# Patient Record
Sex: Female | Born: 1955 | Race: White | Hispanic: No | Marital: Married | Smoking: Never smoker
Health system: Southern US, Community
[De-identification: ages and names within clinical notes are randomized; demographics above are authoritative.]

## PROBLEM LIST (undated history)

## (undated) DIAGNOSIS — K219 Gastro-esophageal reflux disease without esophagitis: Secondary | ICD-10-CM

## (undated) DIAGNOSIS — E079 Disorder of thyroid, unspecified: Secondary | ICD-10-CM

## (undated) DIAGNOSIS — E039 Hypothyroidism, unspecified: Secondary | ICD-10-CM

## (undated) DIAGNOSIS — F419 Anxiety disorder, unspecified: Secondary | ICD-10-CM

## (undated) DIAGNOSIS — R0600 Dyspnea, unspecified: Secondary | ICD-10-CM

## (undated) DIAGNOSIS — R32 Unspecified urinary incontinence: Secondary | ICD-10-CM

## (undated) DIAGNOSIS — R51 Headache: Secondary | ICD-10-CM

## (undated) DIAGNOSIS — D649 Anemia, unspecified: Secondary | ICD-10-CM

## (undated) DIAGNOSIS — Z860100 Personal history of colon polyps, unspecified: Secondary | ICD-10-CM

## (undated) DIAGNOSIS — R011 Cardiac murmur, unspecified: Secondary | ICD-10-CM

## (undated) DIAGNOSIS — C4491 Basal cell carcinoma of skin, unspecified: Secondary | ICD-10-CM

## (undated) DIAGNOSIS — Z8489 Family history of other specified conditions: Secondary | ICD-10-CM

## (undated) DIAGNOSIS — T7840XA Allergy, unspecified, initial encounter: Secondary | ICD-10-CM

## (undated) DIAGNOSIS — F509 Eating disorder, unspecified: Secondary | ICD-10-CM

## (undated) DIAGNOSIS — T4145XA Adverse effect of unspecified anesthetic, initial encounter: Secondary | ICD-10-CM

## (undated) DIAGNOSIS — M1990 Unspecified osteoarthritis, unspecified site: Secondary | ICD-10-CM

## (undated) DIAGNOSIS — F32A Depression, unspecified: Secondary | ICD-10-CM

## (undated) DIAGNOSIS — R519 Headache, unspecified: Secondary | ICD-10-CM

## (undated) DIAGNOSIS — F329 Major depressive disorder, single episode, unspecified: Secondary | ICD-10-CM

## (undated) DIAGNOSIS — E785 Hyperlipidemia, unspecified: Secondary | ICD-10-CM

## (undated) DIAGNOSIS — T8859XA Other complications of anesthesia, initial encounter: Secondary | ICD-10-CM

## (undated) HISTORY — DX: Personal history of colon polyps, unspecified: Z86.0100

## (undated) HISTORY — DX: Allergy, unspecified, initial encounter: T78.40XA

## (undated) HISTORY — DX: Unspecified urinary incontinence: R32

## (undated) HISTORY — DX: Headache: R51

## (undated) HISTORY — DX: Cardiac murmur, unspecified: R01.1

## (undated) HISTORY — DX: Gastro-esophageal reflux disease without esophagitis: K21.9

## (undated) HISTORY — DX: Depression, unspecified: F32.A

## (undated) HISTORY — DX: Major depressive disorder, single episode, unspecified: F32.9

## (undated) HISTORY — DX: Basal cell carcinoma of skin, unspecified: C44.91

## (undated) HISTORY — DX: Hyperlipidemia, unspecified: E78.5

## (undated) HISTORY — DX: Personal history of colonic polyps: Z86.010

## (undated) HISTORY — DX: Headache, unspecified: R51.9

## (undated) HISTORY — DX: Eating disorder, unspecified: F50.9

## (undated) HISTORY — DX: Disorder of thyroid, unspecified: E07.9

## (undated) HISTORY — PX: CERVICAL ABLATION: SHX5771

---
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
## 2008-09-20 HISTORY — PX: CHOLECYSTECTOMY: SHX55

## 2010-09-20 HISTORY — PX: BREAST BIOPSY: SHX20

## 2010-09-20 HISTORY — PX: MENISCUS REPAIR: SHX5179

## 2012-02-02 ENCOUNTER — Ambulatory Visit: Payer: Self-pay | Admitting: Family Medicine

## 2012-02-28 LAB — HM MAMMOGRAPHY: HM Mammogram: NORMAL

## 2012-11-09 ENCOUNTER — Ambulatory Visit (INDEPENDENT_AMBULATORY_CARE_PROVIDER_SITE_OTHER): Payer: TRICARE For Life (TFL) | Admitting: Internal Medicine

## 2012-11-09 ENCOUNTER — Encounter: Payer: Self-pay | Admitting: Internal Medicine

## 2012-11-09 VITALS — BP 102/72 | HR 77 | Temp 98.3°F | Resp 16 | Ht 67.5 in | Wt 206.2 lb

## 2012-11-09 MED ORDER — BUPROPION HCL 75 MG PO TABS: 75.0000 mg | ORAL_TABLET | Freq: Two times a day (BID) | ORAL | Status: DC
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
## 2012-11-09 NOTE — Patient Instructions (Addendum)
Take prozac every other day for week 1 ,  Then every  2 days for week 2 then stop  We will increase the wellbutrin to 2 tablets in the morning only after week 1   Continue  1 in the afternoon    This is  my version of a  "Low GI"  Diet:  It is not ultra low carb, but will still lower your blood sugars and allow you to lose 5 to 10 lbs per month if you follow it carefully. All of the foods can be found at grocery stores and in bulk at [employer].  The Atkins protein bars and shakes are available in more varieties at Target, [employer] and [employer].     7 AM Breakfast:  Low carbohydrate Protein  Shakes (I recommend the EAS AdvantEdge "Carb Control" shakes  Or the low carb shakes by Atkins.   Both are available everywhere:  In  cases at [employer]Js  Or in 4 packs at grocery stores and pharmacies  2.5 carbs  (Alternative is  a toasted Arnold's Sandwhich Thin w/ peanut butter, a "Bagel Thin" with cream cheese and salmon) or  a scrambled egg burrito made with a low carb tortilla .  Avoid cereal and bananas, oatmeal too unless you are cooking the old fashioned kind that takes 30-40 minutes to prepare.  the rest is overly processed, has minimal fiber, and is loaded with carbohydrates!   10 AM: Protein bar by Atkins (the snack size, under 200 cal).  There are many varieties , available widely again or in bulk in limited varieties at BJs)  Other so called "protein bars" tend to be loaded with carbohydrates.  Remember, in food advertising, the word "energy" is synonymous for " carbohydrate."  Lunch: sandwich of [place], (or any lunchmeat, grilled meat or canned tuna), fresh avocado, mayonnaise  and cheese on a lower carbohydrate pita bread, flatbread, or tortilla . Ok to use regular mayonnaise. The bread is the only source or carbohydrate that can be decreased (Joseph's makes a pita bread and a flat bread that are 50 cal and 4 net carbs ; Toufayan makes a low carb flatbread that's 100 cal and 9 net carbs  and  [place]ission  makes a low carb whole wheat tortilla  That is 210 cal and 6 net carbs)  3 PM:  Mid day :  Another protein bar,  Or a  cheese stick (100 cal, 0 carbs),  Or 1 ounce of  almonds, walnuts, pistachios, pecans, peanuts,  Macadamia nuts. Or a Dannon light n Fit greek yogurt, 80 cal 8 net carbs . Avoid "granola"; the dried cranberries and raisins are loaded with carbohydrates. Mixed nuts ok if no raisins or cranberries or dried fruit.      6 PM  Dinner:  "mean and green:"  Meat/chicken/fish or a high protein legume; , with a green salad, and a low GI  Veggie (broccoli, cauliflower, green beans, spinach, brussel sprouts. Lima beans) : Avoid "Low fat dressings, as well as Reyne Dumas and [address]! They are loaded with sugar! Instead use ranch, vinagrette,  Blue cheese, etc.  There is a low carb pasta by Dreamfield's available at [facility] that is acceptable and tastes great. Try Michel Angel's chicken piccata over low carb pasta. The chicken dish is 0 carbs, and can be found in frozen section at BJs and [employer]'s. Also try [employer]" (pulled pork, no sauce,  0 carbs) and his pot roast.   both  are in the refrigerated section at BJs   Dreamfield's makes a low carb pasta only 5 g/serving.  Available at all grocery stores,  And tastes like normal pasta  9 PM snack : Breyer's "low carb" fudgsicle or  ice cream bar (Carb Smart line), or  Weight Watcher's ice cream bar , or another "no sugar added" ice cream;a serving of fresh berries/cherries with whipped cream (Avoid bananas, pineapple, grapes  and watermelon on a regular basis because they are high in sugar)   Remember that snack Substitutions should be less than 10 carbs per serving and meals < 20 carbs. Remember to subtract fiber grams and sugar alcohols to get the "net carbs."

## 2012-11-12 ENCOUNTER — Encounter: Payer: Self-pay | Admitting: Internal Medicine

## 2012-11-12 DIAGNOSIS — Z87828 Personal history of other (healed) physical injury and trauma: Secondary | ICD-10-CM | POA: Insufficient documentation

## 2012-11-12 DIAGNOSIS — M1990 Unspecified osteoarthritis, unspecified site: Secondary | ICD-10-CM | POA: Insufficient documentation

## 2012-11-12 DIAGNOSIS — K219 Gastro-esophageal reflux disease without esophagitis: Secondary | ICD-10-CM | POA: Insufficient documentation

## 2012-11-12 DIAGNOSIS — E039 Hypothyroidism, unspecified: Secondary | ICD-10-CM | POA: Insufficient documentation

## 2012-11-12 DIAGNOSIS — E785 Hyperlipidemia, unspecified: Secondary | ICD-10-CM | POA: Insufficient documentation

## 2012-11-12 NOTE — Assessment & Plan Note (Signed)
There will be no change in thyroid supplement until I can review recent or new TSH

## 2012-11-12 NOTE — Assessment & Plan Note (Signed)
Reasonable  controlled on enablex.  No changes today.

## 2012-11-12 NOTE — Assessment & Plan Note (Signed)
Sees Dr Patrici Ranks chiropractor as needed. No radicular symptoms.

## 2012-11-12 NOTE — Assessment & Plan Note (Addendum)
Trial of wellbutrin for mental fogginess. Kelly Vaughn  Has no interest in estrogen/progesterone bc of advice from family but has great faith in bioidentical hormones. contineu black cohosh, evening primrose sinc ethey are not likely to be harmful.

## 2012-11-12 NOTE — Assessment & Plan Note (Signed)
I have addressed  BMI and recommended a low glycemic index diet utilizing smaller more frequent meals to increase metabolism.  I have also recommended that patient start exercising with a goal of 30 minutes of aerobic exercise a minimum of 5 days per week. Screening for lipid disorders, thyroid and diabetes to be done today.   

## 2012-11-12 NOTE — Assessment & Plan Note (Signed)
continue tylenol, glucosamine ,  recommend weight loss.

## 2012-11-12 NOTE — Assessment & Plan Note (Signed)
Strongly recommended referral to local psychiatry if the change in therapy from from prozac to wellbutrin is not helpful given her many drugn intolerances and history complicated by menopausal syndrome.  Continue seroquel but may consider trial of lamictal.

## 2012-11-12 NOTE — Assessment & Plan Note (Signed)
Per patient , well controlled on current regimen. Records requested.

## 2012-12-05 ENCOUNTER — Ambulatory Visit (INDEPENDENT_AMBULATORY_CARE_PROVIDER_SITE_OTHER): Payer: TRICARE For Life (TFL) | Admitting: Internal Medicine

## 2012-12-05 ENCOUNTER — Encounter: Payer: Self-pay | Admitting: Internal Medicine

## 2012-12-05 ENCOUNTER — Other Ambulatory Visit: Payer: Self-pay | Admitting: General Practice

## 2012-12-05 VITALS — BP 124/72 | HR 76 | Temp 98.1°F | Resp 16 | Ht 67.75 in | Wt 196.2 lb

## 2012-12-05 DIAGNOSIS — M1712 Unilateral primary osteoarthritis, left knee: Secondary | ICD-10-CM

## 2012-12-05 DIAGNOSIS — Z23 Encounter for immunization: Secondary | ICD-10-CM

## 2012-12-05 MED ORDER — BUPROPION HCL 75 MG PO TABS: 150.0000 mg | ORAL_TABLET | Freq: Every day | ORAL | Status: DC

## 2012-12-05 NOTE — Assessment & Plan Note (Signed)
Improved with trial of Wellbutrin and tapering suspension of present.. No changes today.

## 2012-12-05 NOTE — Assessment & Plan Note (Signed)
She is already lost 10 pounds in 3 weeks with a low carbohydrate/low glycemic index diet. Encouragement given.

## 2012-12-05 NOTE — Telephone Encounter (Signed)
Both sent 

## 2012-12-05 NOTE — Assessment & Plan Note (Signed)
With meniscal tear and loss of joint space. 4 arthroscopic surgery by Dr. Ernest Pine. Plans for total knee replacement have been postponed for 3 years.

## 2012-12-05 NOTE — Telephone Encounter (Signed)
Pt would like a 30 day supply of Wellbutrin sent  Into [facility] on [address] and a 90 day sent to [facility]. Ok to fill?
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
## 2012-12-07 NOTE — Telephone Encounter (Signed)
Phoned in 3/20.

## 2012-12-08 ENCOUNTER — Telehealth: Payer: Self-pay | Admitting: Internal Medicine

## 2012-12-08 NOTE — Telephone Encounter (Signed)
Pt called wanting appointment on Monday with dr Darrick Huntsman.  Offered appointment with raquel for Monday.  Pt went to urgent care  [facility] last night.3-20  She stated the dr wasn't sure if she had shingles or chicken pox.  She has several ? About this.  Pt wanted to know if she would be contagious.  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

## 2012-12-09 ENCOUNTER — Encounter: Payer: Self-pay | Admitting: Internal Medicine

## 2012-12-11 ENCOUNTER — Telehealth: Payer: Self-pay | Admitting: Internal Medicine

## 2012-12-11 NOTE — Telephone Encounter (Signed)
The patient has an appointment on Friday March 28 . She is wanting to be seen sooner .

## 2012-12-11 NOTE — Telephone Encounter (Signed)
Please advise. This is in regards to pt message form earlier.

## 2012-12-11 NOTE — Telephone Encounter (Signed)
Patient notified.  Appointment scheduled.

## 2012-12-11 NOTE — Telephone Encounter (Signed)
If someone else in the office  can see her ok to give her an earlier appt.

## 2012-12-12 ENCOUNTER — Ambulatory Visit (INDEPENDENT_AMBULATORY_CARE_PROVIDER_SITE_OTHER): Payer: TRICARE For Life (TFL) | Admitting: Internal Medicine

## 2012-12-12 ENCOUNTER — Encounter: Payer: Self-pay | Admitting: Internal Medicine

## 2012-12-12 ENCOUNTER — Telehealth: Payer: Self-pay | Admitting: *Deleted

## 2012-12-12 VITALS — BP 124/78 | HR 84 | Temp 98.4°F | Resp 16 | Wt 194.0 lb

## 2012-12-12 DIAGNOSIS — R21 Rash and other nonspecific skin eruption: Secondary | ICD-10-CM

## 2012-12-12 LAB — CBC WITH DIFFERENTIAL/PLATELET
Basophils Absolute: 0 10*3/uL (ref 0.0–0.1)
Basophils Relative: 0.5 % (ref 0.0–3.0)
Eosinophils Absolute: 0.3 10*3/uL (ref 0.0–0.7)
Lymphocytes Relative: 25.7 % (ref 12.0–46.0)
MCHC: 33.4 g/dL (ref 30.0–36.0)
Neutrophils Relative %: 53.1 % (ref 43.0–77.0)
Platelets: 179 10*3/uL (ref 150.0–400.0)
RBC: 4.64 Mil/uL (ref 3.87–5.11)

## 2012-12-12 MED ORDER — THYROID 65 MG PO TABS: 65.0000 mg | ORAL_TABLET | Freq: Every day | ORAL | Status: DC

## 2012-12-12 NOTE — Patient Instructions (Addendum)
Titers for chickenpox to be drawn today  Avoid contact with pregnant women until rash resolves  Thyroid  dose increased to 65 mg daily; return for TSH test 6 weeks after dose change

## 2012-12-12 NOTE — Progress Notes (Signed)
Patient ID: Kelly Vaughn, female   DOB: November 26, 1955, 57 y.o.   MRN: [id number]    Patient Active Problem List  Diagnosis  . allergic rhinitis  . Bipolar I disorder, most recent episode depressed  . Obesity, unspecified  . Headache disorder  . GERD (gastroesophageal reflux disease)  . Hyperlipidemia  . Urinary incontinence  . Hx of colonic polyps  . Basal cell carcinoma  . Unspecified hypothyroidism  . Menopausal syndrome  . Left knee DJD  . History of whiplash injury  . Rash/skin eruption    Subjective:  CC:   Chief Complaint  Patient presents with  . Red Spots    HPI:   Kelly Vaughn a 57 y.o. female who presents with sudden onset of maculopapular rash which occurred on her trunk and arms 5 days prior to admission. She had concurrent right-sided otitis which was treated urgent care with amoxicillin. The rash was treated with acyclovir. She was told she had shingles. She had past exposure to 76-year-old 2 weeks ago who was also diagnosed with shingles. However patient denies any history of pain or burning. She does note that the rash is itching. She only had one or 2 papules that had a vesicular appearance to them the rest were just red and itchy. She reports having had a history of chickenpox as a child but does not remember how severe it was. The 52-year-old that she was exposed to and apparently also had chickenpox previously.    Past Medical History  Diagnosis Date  . allergic rhinitis   . Depression   . Eating disorder   . Headache disorder   . GERD (gastroesophageal reflux disease)   . Hyperlipidemia   . hypothyroidism   . Urinary incontinence   . Hx of colonic polyps   . Basal cell carcinoma     Past Surgical History  Procedure Laterality Date  . Cesarean section  1985  and 1989  . Cholecystectomy  2010  . Breast biopsy  2012  . Tonsillectomy and adenoidectomy  1960       The following portions of the patient's history were reviewed and updated as  appropriate: Allergies, current medications, and problem list.    Review of Systems:   Patient denies headache, fevers, malaise, unintentional weight loss, skin rash, eye pain, sinus congestion and sinus pain, sore throat, dysphagia,  hemoptysis , cough, dyspnea, wheezing, chest pain, palpitations, orthopnea, edema, abdominal pain, nausea, melena, diarrhea, constipation, flank pain, dysuria, hematuria, urinary  Frequency, nocturia, numbness, tingling, seizures,  Focal weakness, Loss of consciousness,  Tremor, insomnia, depression, anxiety, and suicidal ideation.     History   Social History  . Marital Status: Married    Spouse Name: N/A    Number of Children: N/A  . Years of Education: N/A   Occupational History  . Not on file.   Social History Main Topics  . Smoking status: Never Smoker   . Smokeless tobacco: Never Used  . Alcohol Use: Yes  . Drug Use: No  . Sexually Active: Not on file   Other Topics Concern  . Not on file   Social History Narrative  . No narrative on file    Objective:  BP 124/78  Pulse 84  Temp(Src) 98.4 F (36.9 C) (Oral)  Resp 16  Wt 194 lb (87.998 kg)  BMI 29.71 kg/m2  SpO2 97%  General appearance: alert, cooperative and appears stated age Ears: normal TM's and external ear canals both ears Throat: lips, mucosa, and tongue  normal; teeth and gums normal Neck: no adenopathy, no carotid bruit, supple, symmetrical, trachea midline and thyroid not enlarged, symmetric, no tenderness/mass/nodules Back: symmetric, no curvature. ROM normal. No CVA tenderness. Lungs: clear to auscultation bilaterally Heart: regular rate and rhythm, S1, S2 normal, no murmur, click, rub or gallop Skin: Diffuse symmetric maculopapular rash covering abdomen and arms. One healing lesion on the left lower leg.  Skin color, texture, turgor normal. No rashes or lesions Lymph nodes: Cervical, supraclavicular, and axillary nodes normal.  Assessment and Plan:  Rash/skin  eruption Her rash is not have the appearance of shingles nor the history for shingles. This could have been a chickenpox rash if she had a very mild case of chickenpox as a child. I am checking varicella antibodies today. Continue acyclovir. Avoid contact with pregnant women.   Updated Medication List Outpatient Encounter Prescriptions as of 12/12/2012  Medication Sig Dispense Refill  . acetaminophen (TYLENOL ARTHRITIS PAIN) 650 MG CR tablet Take 1,300 mg by mouth every 8 (eight) hours as needed for pain.      Marland Kitchen acyclovir (ZOVIRAX) 800 MG tablet Take 800 mg by mouth 4 (four) times daily.      . Alpha-Lipoic Acid 300 MG CAPS Take 1 capsule by mouth daily.      Marland Kitchen amoxicillin (AMOXIL) 875 MG tablet Take 875 mg by mouth 2 (two) times daily.      . Ascorbic Acid (VITAMIN C) 1000 MG tablet Take 1,000 mg by mouth daily.      . Biotin 5000 MCG CAPS Take 1 capsule by mouth daily.      . Black Cohosh 540 MG CAPS Take 1 capsule by mouth daily.      Marland Kitchen buPROPion (WELLBUTRIN) 75 MG tablet Take 2 tablets (150 mg total) by mouth daily.  60 tablet  0  . celecoxib (CELEBREX) 200 MG capsule Take 200 mg by mouth 2 (two) times daily.      . CHELATED CALCIUM PO Take 1,200 mg by mouth daily.      . Chelated Zinc 50 MG TABS Take 1 tablet by mouth daily.      . Cholecalciferol (VITAMIN D) 2000 UNITS CAPS Take 2 capsules by mouth daily.      . Chromium Picolinate 500 MCG TABS Take 1 tablet by mouth daily.      . Coenzyme Q10 (CO Q-10) 100 MG CAPS Take 1 capsule by mouth daily.      Marland Kitchen darifenacin (ENABLEX) 7.5 MG 24 hr tablet Take 1/2 tablet by mouth as needed      . diphenhydrAMINE (BENADRYL) 25 MG tablet Take 25 mg by mouth every 6 (six) hours as needed for itching.      . Estradiol (VAGIFEM) 10 MCG TABS Insert 1 tablet by vaginal route once daily for 14 days then 1 tablet 2 times per week for duration of use.      . Evening Primrose Oil 500 MG CAPS Take 1 capsule by mouth daily.      . Glucosamine-Chondroit-Vit C-Mn  (GLUCOSAMINE CHONDR 1500 COMPLX PO) Take 1,500 mg by mouth daily.      . Lactobacillus (PROBIOTIC ACIDOPHILUS PO) Take 32 mg by mouth daily.      . OMEGA 3 1200 MG CAPS Take 2 capsules by mouth daily.      . Potassium 99 MG TABS Take 1 tablet by mouth daily.      . progesterone (PROMETRIUM) 100 MG capsule Take 100 mg by mouth daily.      Marland Kitchen  QUEtiapine (SEROQUEL) 50 MG tablet Take 150 mg by mouth at bedtime.      . Selenium 200 MCG TABS Take 1 tablet by mouth daily.      Marland Kitchen thyroid (ARMOUR) 65 MG tablet Take 1 tablet (65 mg total) by mouth daily.  90 tablet  3  . TURMERIC CURCUMIN PO Take 800 mg by mouth daily.      . [DISCONTINUED] thyroid (ARMOUR) 60 MG tablet Take 60 mg by mouth daily.       No facility-administered encounter medications on file as of 12/12/2012.     Orders Placed This Encounter  Procedures  . CBC with Differential  . Varicella Zoster Abs, IgG/IgM  . Sedimentation rate    No Follow-up on file.

## 2012-12-12 NOTE — Telephone Encounter (Signed)
Pt seen today

## 2012-12-12 NOTE — Telephone Encounter (Signed)
Called patient to let her know we could fit her in for an appointment with Dr. Darrick Huntsman at 9:30 on 12/12/12.

## 2012-12-13 ENCOUNTER — Encounter: Payer: Self-pay | Admitting: Internal Medicine

## 2012-12-13 DIAGNOSIS — R21 Rash and other nonspecific skin eruption: Secondary | ICD-10-CM | POA: Insufficient documentation

## 2012-12-13 LAB — VARICELLA ZOSTER ABS, IGG/IGM
Varicella IgM: <0.91 index (ref 0.00–0.90)
Varicella zoster IgG: 185 index — ABNORMAL HIGH (ref 0–134)

## 2012-12-13 NOTE — Assessment & Plan Note (Signed)
Her rash is not have the appearance of shingles nor the history for shingles. This could have been a chickenpox rash if she had a very mild case of chickenpox as a child. I am checking varicella antibodies today. Continue acyclovir. Avoid contact with pregnant women.

## 2012-12-14 ENCOUNTER — Encounter: Payer: Self-pay | Admitting: Internal Medicine

## 2012-12-15 ENCOUNTER — Ambulatory Visit: Payer: TRICARE For Life (TFL) | Admitting: Internal Medicine

## 2012-12-18 ENCOUNTER — Encounter: Payer: Self-pay | Admitting: Internal Medicine

## 2012-12-21 ENCOUNTER — Ambulatory Visit: Payer: Self-pay | Admitting: General Practice

## 2012-12-21 ENCOUNTER — Encounter: Payer: Self-pay | Admitting: Internal Medicine

## 2012-12-21 DIAGNOSIS — E039 Hypothyroidism, unspecified: Secondary | ICD-10-CM

## 2013-01-01 ENCOUNTER — Telehealth: Payer: Self-pay | Admitting: Internal Medicine

## 2013-01-01 NOTE — Telephone Encounter (Signed)
Informing that she is having arthroscopic knee surgery Wednesday for torn meniscus.  Pt cancelled her appt on Monday 4/21 due to having the surgery on 4/16.

## 2013-01-03 ENCOUNTER — Ambulatory Visit: Payer: Self-pay | Admitting: General Practice

## 2013-01-08 ENCOUNTER — Ambulatory Visit: Payer: TRICARE For Life (TFL) | Admitting: Internal Medicine

## 2013-01-14 ENCOUNTER — Encounter: Payer: Self-pay | Admitting: Internal Medicine

## 2013-01-16 MED ORDER — PROGESTERONE MICRONIZED 100 MG PO CAPS: 100.0000 mg | ORAL_CAPSULE | Freq: Every day | ORAL | Status: DC

## 2013-01-16 MED ORDER — QUETIAPINE FUMARATE 50 MG PO TABS: 150.0000 mg | ORAL_TABLET | Freq: Every day | ORAL | Status: DC

## 2013-01-26 ENCOUNTER — Encounter: Payer: Self-pay | Admitting: Internal Medicine

## 2013-01-26 DIAGNOSIS — Z1239 Encounter for other screening for malignant neoplasm of breast: Secondary | ICD-10-CM

## 2013-01-30 ENCOUNTER — Other Ambulatory Visit: Payer: Self-pay | Admitting: Internal Medicine

## 2013-01-30 DIAGNOSIS — Z1239 Encounter for other screening for malignant neoplasm of breast: Secondary | ICD-10-CM

## 2013-01-30 LAB — HM PAP SMEAR: HM Pap smear: NORMAL

## 2013-02-02 ENCOUNTER — Other Ambulatory Visit: Payer: Self-pay | Admitting: Internal Medicine

## 2013-02-02 DIAGNOSIS — Z1231 Encounter for screening mammogram for malignant neoplasm of breast: Secondary | ICD-10-CM

## 2013-02-02 NOTE — Telephone Encounter (Signed)
Talk with patient today records are to be sent before mammogram.

## 2013-02-02 NOTE — Telephone Encounter (Signed)
Called patient left message to call office.

## 2013-02-02 NOTE — Telephone Encounter (Signed)
Patient has mammogram scheduled for 6/2 and wants to make sure they have a copy of all past mammograms, X- rays, and ultra-sounds and MRi's.

## 2013-02-05 ENCOUNTER — Encounter: Payer: Self-pay | Admitting: Internal Medicine

## 2013-02-05 NOTE — Telephone Encounter (Signed)
Pt sent myChart message, requesting Celebrex 100 mg 2 caps bid, has been taking since torn meniscus repair 6 weeks ago. Please advise if ok, requesting 3 months to mail order.

## 2013-02-06 MED ORDER — CELECOXIB 200 MG PO CAPS: 200.0000 mg | ORAL_CAPSULE | Freq: Two times a day (BID) | ORAL | Status: DC

## 2013-02-06 NOTE — Telephone Encounter (Signed)
celebres refill sent

## 2013-02-13 ENCOUNTER — Telehealth: Payer: Self-pay | Admitting: Internal Medicine

## 2013-02-13 ENCOUNTER — Encounter: Payer: Self-pay | Admitting: Internal Medicine

## 2013-02-13 ENCOUNTER — Ambulatory Visit (INDEPENDENT_AMBULATORY_CARE_PROVIDER_SITE_OTHER): Payer: TRICARE For Life (TFL) | Admitting: Internal Medicine

## 2013-02-13 VITALS — BP 122/70 | HR 81 | Temp 98.6°F | Resp 16 | Wt 185.5 lb

## 2013-02-13 DIAGNOSIS — J029 Acute pharyngitis, unspecified: Secondary | ICD-10-CM

## 2013-02-13 DIAGNOSIS — E669 Obesity, unspecified: Secondary | ICD-10-CM

## 2013-02-13 MED ORDER — MOMETASONE FUROATE 50 MCG/ACT NA SUSP: NASAL | Status: DC

## 2013-02-13 MED ORDER — THYROID 60 MG PO TABS: 60.0000 mg | ORAL_TABLET | Freq: Every day | ORAL | Status: DC

## 2013-02-13 NOTE — Telephone Encounter (Signed)
I ave many [place]ers

## 2013-02-13 NOTE — Telephone Encounter (Signed)
Ms leventhal would like you to call her when you can

## 2013-02-13 NOTE — Patient Instructions (Addendum)
Simply Saline flushes several times daily  Take the zyrtec in the am    Add a 25 kg dose of benadryl (dipenhydramine )  at night to dry you up   Add a steroid nasal spray (nasonex):  Two sprays in each nostril once a day( 4 squirts total daily)  You can also gargle with salt water to reduce the swelling in your throat

## 2013-02-13 NOTE — Telephone Encounter (Signed)
Would like to know if paper work has been filed for testosterone injections for husband?

## 2013-02-13 NOTE — Telephone Encounter (Signed)
I do not have her husband's name in her chart so I cannot tell her that,

## 2013-02-13 NOTE — Progress Notes (Signed)
Patient ID: Kelly Vaughn, female   DOB: 12-07-1955, 57 y.o.   MRN: [id number]  Patient Active Problem List   Diagnosis Date Noted  . Acute pharyngitis 02/13/2013  . Rash/skin eruption 12/13/2012  . Unspecified hypothyroidism 11/12/2012  . Menopausal syndrome 11/12/2012  . Left knee DJD 11/12/2012  . History of whiplash injury 11/12/2012  . allergic rhinitis   . Bipolar I disorder, most recent episode depressed   . Obesity, unspecified   . Headache disorder   . GERD (gastroesophageal reflux disease)   . Hyperlipidemia   . Urinary incontinence   . Hx of colonic polyps   . Basal cell carcinoma     Subjective:  CC:   Chief Complaint  Patient presents with  . Acute Visit    sore throat, hoarse     HPI:   Kelly Vaughn a 57 y.o. female who presents Treated for pharyngitis ,  Reactive to an irritant,  Strep test was negative.  Thinks ti swas the bamboo flooring that was installed recently   Lots of sawdust in the air. Urgent Care last week .  Started zyrtec . Coughing only at night when she lies down.  Symptoms improve when she leaves the house so the bamboo sawdust seems to be an irritant.  Has lost 22 lbs on the low GI diet.,  Had arthroscopic knee surgery on the left knee. Not hungry on the diet.  Very happy with the results. Thinking about trying gluten free to see if it makes her "brain fog" go away.     Past Medical History  Diagnosis Date  . allergic rhinitis   . Depression   . Eating disorder   . Headache disorder   . GERD (gastroesophageal reflux disease)   . Hyperlipidemia   . hypothyroidism   . Urinary incontinence   . Hx of colonic polyps   . Basal cell carcinoma     Past Surgical History  Procedure Laterality Date  . Cesarean section  1985  and 1989  . Cholecystectomy  2010  . Breast biopsy  2012  . Tonsillectomy and adenoidectomy  1960       The following portions of the patient's history were reviewed and updated as appropriate: Allergies,  current medications, and problem list.    Review of Systems:   12 Pt  review of systems was negative except those addressed in the HPI,     History   Social History  . Marital Status: Married    Spouse Name: N/A    Number of Children: N/A  . Years of Education: N/A   Occupational History  . Not on file.   Social History Main Topics  . Smoking status: Never Smoker   . Smokeless tobacco: Never Used  . Alcohol Use: Yes  . Drug Use: No  . Sexually Active: Not on file   Other Topics Concern  . Not on file   Social History Narrative  . No narrative on file    Objective:  BP 122/70  Pulse 81  Temp(Src) 98.6 F (37 C) (Oral)  Resp 16  Wt 185 lb 8 oz (84.142 kg)  BMI 28.41 kg/m2  SpO2 98%  General appearance: alert, cooperative and appears stated age Ears: normal TM's and external ear canals both ears Throat: lips, mucosa, and tongue normal; teeth and gums normal Neck: no adenopathy, no carotid bruit, supple, symmetrical, trachea midline and thyroid not enlarged, symmetric, no tenderness/mass/nodules Back: symmetric, no curvature. ROM normal. No CVA tenderness. Lungs: clear  to auscultation bilaterally Heart: regular rate and rhythm, S1, S2 normal, no murmur, click, rub or gallop Abdomen: soft, non-tender; bowel sounds normal; no masses,  no organomegaly Pulses: 2+ and symmetric Skin: Skin color, texture, turgor normal. No rashes or lesions Lymph nodes: Cervical, supraclavicular, and axillary nodes normal.  Assessment and Plan:  Acute pharyngitis Aggravated by pollen.  supportive care outlined  Obesity, unspecified Improved, with low GI diet effecting a 22 lb wt loss since March.   Updated Medication List Outpatient Encounter Prescriptions as of 02/13/2013  Medication Sig Dispense Refill  . acetaminophen (TYLENOL ARTHRITIS PAIN) 650 MG CR tablet Take 1,300 mg by mouth every 8 (eight) hours as needed for pain.      . Alpha-Lipoic Acid 300 MG CAPS Take 1  capsule by mouth daily.      . Ascorbic Acid (VITAMIN C) 1000 MG tablet Take 1,000 mg by mouth daily.      . Biotin 5000 MCG CAPS Take 1 capsule by mouth daily.      . Black Cohosh 540 MG CAPS Take 1 capsule by mouth daily.      Marland Kitchen buPROPion (WELLBUTRIN) 75 MG tablet Take 2 tablets (150 mg total) by mouth daily.  60 tablet  0  . celecoxib (CELEBREX) 200 MG capsule Take 1 capsule (200 mg total) by mouth 2 (two) times daily.  180 capsule  0  . cetirizine (ZYRTEC) 10 MG tablet Take 10 mg by mouth daily.      . CHELATED CALCIUM PO Take 1,200 mg by mouth daily.      . Chelated Zinc 50 MG TABS Take 1 tablet by mouth daily.      . Cholecalciferol (VITAMIN D) 2000 UNITS CAPS Take 2 capsules by mouth daily.      . Chromium Picolinate 500 MCG TABS Take 1 tablet by mouth daily.      . Coenzyme Q10 (CO Q-10) 100 MG CAPS Take 1 capsule by mouth daily.      . Estradiol (VAGIFEM) 10 MCG TABS Insert 1 tablet by vaginal route once daily for 14 days then 1 tablet 2 times per week for duration of use.      . Evening Primrose Oil 500 MG CAPS Take 1 capsule by mouth daily.      . Glucosamine-Chondroit-Vit C-Mn (GLUCOSAMINE CHONDR 1500 COMPLX PO) Take 1,500 mg by mouth daily.      . Lactobacillus (PROBIOTIC ACIDOPHILUS PO) Take 32 mg by mouth daily.      . OMEGA 3 1200 MG CAPS Take 2 capsules by mouth daily.      . Potassium 99 MG TABS Take 1 tablet by mouth daily.      . progesterone (PROMETRIUM) 100 MG capsule Take 1 capsule (100 mg total) by mouth daily.  90 capsule  3  . QUEtiapine (SEROQUEL) 50 MG tablet Take 3 tablets (150 mg total) by mouth at bedtime.  270 tablet  2  . Selenium 200 MCG TABS Take 1 tablet by mouth daily.      Marland Kitchen thyroid (ARMOUR) 60 MG tablet Take 1 tablet (60 mg total) by mouth daily.  90 tablet  3  . TURMERIC CURCUMIN PO Take 800 mg by mouth daily.      . [DISCONTINUED] thyroid (ARMOUR) 65 MG tablet Take 1 tablet (65 mg total) by mouth daily.  90 tablet  3  . acyclovir (ZOVIRAX) 800 MG  tablet Take 800 mg by mouth 4 (four) times daily.      Marland Kitchen  amoxicillin (AMOXIL) 875 MG tablet Take 875 mg by mouth 2 (two) times daily.      Marland Kitchen darifenacin (ENABLEX) 7.5 MG 24 hr tablet Take 1/2 tablet by mouth as needed      . diphenhydrAMINE (BENADRYL) 25 MG tablet Take 25 mg by mouth every 6 (six) hours as needed for itching.      . mometasone (NASONEX) 50 MCG/ACT nasal spray 2 squirts in each nostril once a day  17 g  12   No facility-administered encounter medications on file as of 02/13/2013.     No orders of the defined types were placed in this encounter.    No Follow-up on file.

## 2013-02-14 ENCOUNTER — Encounter: Payer: Self-pay | Admitting: Internal Medicine

## 2013-02-14 NOTE — Assessment & Plan Note (Signed)
Improved, with low GI diet effecting a 22 lb wt loss since March.

## 2013-02-14 NOTE — Telephone Encounter (Signed)
Please check for patient per her request  About husband's testosterone injection papers

## 2013-02-14 NOTE — Telephone Encounter (Signed)
Sorry Evalina Field

## 2013-02-14 NOTE — Assessment & Plan Note (Signed)
Aggravated by pollen.  supportive care outlined

## 2013-02-14 NOTE — Telephone Encounter (Signed)
Notified patient by voicemail that authorization was filled, signed. And faxed on 02/08/13

## 2013-02-19 ENCOUNTER — Ambulatory Visit (HOSPITAL_COMMUNITY)
Admission: RE | Admit: 2013-02-19 | Discharge: 2013-02-19 | Disposition: A | Discharge status: Home / Self Care | Source: Ambulatory Visit | Attending: Internal Medicine | Admitting: Internal Medicine

## 2013-02-19 ENCOUNTER — Inpatient Hospital Stay (HOSPITAL_COMMUNITY): Admission: RE | Admit: 2013-02-19 | Payer: TRICARE For Life (TFL) | Source: Ambulatory Visit

## 2013-02-19 DIAGNOSIS — Z1231 Encounter for screening mammogram for malignant neoplasm of breast: Secondary | ICD-10-CM | POA: Insufficient documentation

## 2013-02-22 ENCOUNTER — Other Ambulatory Visit: Payer: Self-pay | Admitting: Internal Medicine

## 2013-02-22 MED ORDER — ZOSTER VACCINE LIVE 19400 UNT/0.65ML ~~LOC~~ SOLR: 0.6500 mL | Freq: Once | SUBCUTANEOUS | Status: DC

## 2013-05-04 ENCOUNTER — Encounter: Payer: Self-pay | Admitting: Internal Medicine

## 2013-05-11 ENCOUNTER — Ambulatory Visit (INDEPENDENT_AMBULATORY_CARE_PROVIDER_SITE_OTHER): Payer: TRICARE For Life (TFL) | Admitting: Internal Medicine

## 2013-05-11 ENCOUNTER — Encounter: Payer: Self-pay | Admitting: Internal Medicine

## 2013-05-11 VITALS — BP 128/76 | HR 79 | Temp 98.2°F | Resp 14 | Wt 180.8 lb

## 2013-05-11 DIAGNOSIS — R011 Cardiac murmur, unspecified: Secondary | ICD-10-CM | POA: Insufficient documentation

## 2013-05-11 DIAGNOSIS — R002 Palpitations: Secondary | ICD-10-CM

## 2013-05-11 DIAGNOSIS — R42 Dizziness and giddiness: Secondary | ICD-10-CM

## 2013-05-11 DIAGNOSIS — D649 Anemia, unspecified: Secondary | ICD-10-CM

## 2013-05-11 DIAGNOSIS — E039 Hypothyroidism, unspecified: Secondary | ICD-10-CM

## 2013-05-11 LAB — CBC WITH DIFFERENTIAL/PLATELET
Basophils Absolute: 0 10*3/uL (ref 0.0–0.1)
Basophils Relative: 0 % (ref 0–1)
Eosinophils Relative: 4 % (ref 0–5)
HCT: 39.3 % (ref 36.0–46.0)
MCHC: 33.8 g/dL (ref 30.0–36.0)
Monocytes Absolute: 0.4 10*3/uL (ref 0.1–1.0)
Neutro Abs: 3.2 10*3/uL (ref 1.7–7.7)
Platelets: 177 10*3/uL (ref 150–400)
RDW: 13.6 % (ref 11.5–15.5)

## 2013-05-11 NOTE — Progress Notes (Signed)
Patient ID: Kelly Vaughn, female   DOB: 1956-05-27, 57 y.o.   MRN: [id number]  Patient Active Problem List   Diagnosis Date Noted  . Murmur, heart 05/11/2013  . Dizziness and giddiness 05/11/2013  . Palpitations 05/11/2013  . Acute pharyngitis 02/13/2013  . Rash/skin eruption 12/13/2012  . Unspecified hypothyroidism 11/12/2012  . Menopausal syndrome 11/12/2012  . Left knee DJD 11/12/2012  . History of whiplash injury 11/12/2012  . allergic rhinitis   . Bipolar I disorder, most recent episode depressed   . Obesity, unspecified   . Headache disorder   . GERD (gastroesophageal reflux disease)   . Hyperlipidemia   . Urinary incontinence   . Hx of colonic polyps   . Basal cell carcinoma     Subjective:  CC:   Chief Complaint  Patient presents with  . Acute Visit    week, dizziness, palpitations X 1 week. Not sleeping.    HPI:   Kelly Vaughn a 57 y.o. female who presents Dizziness when bending over and also occurring with heart palpitations.   Hot  flashes out of control  And causing frequent wakening .  Falling asleep during the day but this is chronic.   Has not exercised since her knee surgery in  March.     Walks her 3 dogs twice daily,  Does housecleaning  And been getting an uncomofrtable  sensation in her chest .  Hydrates frequently but drinks half a gallon of iced tea.    Past Medical History  Diagnosis Date  . allergic rhinitis   . Depression   . Eating disorder   . Headache disorder   . GERD (gastroesophageal reflux disease)   . Hyperlipidemia   . hypothyroidism   . Urinary incontinence   . Hx of colonic polyps   . Basal cell carcinoma     Past Surgical History  Procedure Laterality Date  . Cesarean section  1985  and 1989  . Cholecystectomy  2010  . Breast biopsy  2012  . Tonsillectomy and adenoidectomy  1960       The following portions of the patient's history were reviewed and updated as appropriate: Allergies, current medications, and  problem list.    Review of Systems:   12 Pt  review of systems was negative except those addressed in the HPI,     History   Social History  . Marital Status: Married    Spouse Name: N/A    Number of Children: N/A  . Years of Education: N/A   Occupational History  . Not on file.   Social History Main Topics  . Smoking status: Never Smoker   . Smokeless tobacco: Never Used  . Alcohol Use: Yes  . Drug Use: No  . Sexual Activity: Not on file   Other Topics Concern  . Not on file   Social History Narrative  . No narrative on file    Objective:  Filed Vitals:   05/11/13 1533  BP: 128/76  Pulse: 79  Temp: 98.2 F (36.8 C)  Resp: 14     General appearance: alert, cooperative and appears stated age Ears: normal TM's and external ear canals both ears Throat: lips, mucosa, and tongue normal; teeth and gums normal Neck: no adenopathy, no carotid bruit, supple, symmetrical, trachea midline and thyroid not enlarged, symmetric, no tenderness/mass/nodules Back: symmetric, no curvature. ROM normal. No CVA tenderness. Lungs: clear to auscultation bilaterally Heart: regular rate and rhythm, S1, S2 normal, no murmur, click, rub or gallop Abdomen:  soft, non-tender; bowel sounds normal; no masses,  no organomegaly Pulses: 2+ and symmetric Skin: Skin color, texture, turgor normal. No rashes or lesions Lymph nodes: Cervical, supraclavicular, and axillary nodes normal.  Assessment and Plan:  Palpitations History suggests SVT or PAF vs hypoglycemic events.  Thyroid  Iron studies , CBC and ekg is normal.  Advised to reduce caffeine consumption and increase protein in diet, given serum glucose of 57   Updated Medication List Outpatient Encounter Prescriptions as of 05/11/2013  Medication Sig Dispense Refill  . Alpha-Lipoic Acid 300 MG CAPS Take 1 capsule by mouth daily.      . Ascorbic Acid (VITAMIN C) 1000 MG tablet Take 1,000 mg by mouth daily.      . Biotin 5000 MCG CAPS  Take 1 capsule by mouth daily.      . Black Cohosh 540 MG CAPS Take 1 capsule by mouth daily.      Marland Kitchen buPROPion (WELLBUTRIN) 75 MG tablet Take 2 tablets (150 mg total) by mouth daily.  60 tablet  0  . celecoxib (CELEBREX) 200 MG capsule Take 1 capsule (200 mg total) by mouth 2 (two) times daily.  180 capsule  0  . CHELATED CALCIUM PO Take 1,200 mg by mouth daily.      . Chelated Zinc 50 MG TABS Take 1 tablet by mouth daily.      . Cholecalciferol (VITAMIN D) 2000 UNITS CAPS Take 2 capsules by mouth daily.      . Chromium Picolinate 500 MCG TABS Take 1 tablet by mouth daily.      . Coenzyme Q10 (CO Q-10) 100 MG CAPS Take 1 capsule by mouth daily.      . Estradiol (VAGIFEM) 10 MCG TABS Insert 1 tablet by vaginal route once daily for 14 days then 1 tablet 2 times per week for duration of use.      . Glucosamine-Chondroit-Vit C-Mn (GLUCOSAMINE CHONDR 1500 COMPLX PO) Take 1,500 mg by mouth daily.      . Lactobacillus (PROBIOTIC ACIDOPHILUS PO) Take 32 mg by mouth daily.      . OMEGA 3 1200 MG CAPS Take 2 capsules by mouth daily.      . Potassium 99 MG TABS Take 1 tablet by mouth daily.      . progesterone (PROMETRIUM) 100 MG capsule Take 1 capsule (100 mg total) by mouth daily.  90 capsule  3  . QUEtiapine (SEROQUEL) 50 MG tablet Take 3 tablets (150 mg total) by mouth at bedtime.  270 tablet  2  . Selenium 200 MCG TABS Take 1 tablet by mouth daily.      Marland Kitchen thyroid (ARMOUR) 60 MG tablet Take 1 tablet (60 mg total) by mouth daily.  90 tablet  3  . TURMERIC CURCUMIN PO Take 800 mg by mouth daily.      Marland Kitchen acetaminophen (TYLENOL ARTHRITIS PAIN) 650 MG CR tablet Take 1,300 mg by mouth every 8 (eight) hours as needed for pain.      . cetirizine (ZYRTEC) 10 MG tablet Take 10 mg by mouth daily.      Marland Kitchen darifenacin (ENABLEX) 7.5 MG 24 hr tablet Take 1/2 tablet by mouth as needed      . diphenhydrAMINE (BENADRYL) 25 MG tablet Take 25 mg by mouth every 6 (six) hours as needed for itching.      . Evening Primrose  Oil 500 MG CAPS Take 1 capsule by mouth daily.      . mometasone (NASONEX) 50 MCG/ACT nasal  spray 2 squirts in each nostril once a day  17 g  12  . [DISCONTINUED] acyclovir (ZOVIRAX) 800 MG tablet Take 800 mg by mouth 4 (four) times daily.      . [DISCONTINUED] amoxicillin (AMOXIL) 875 MG tablet Take 875 mg by mouth 2 (two) times daily.      . [DISCONTINUED] zoster vaccine live, PF, (ZOSTAVAX) 16109 UNT/0.65ML injection Inject 19,400 Units into the skin once.  1 each  0   No facility-administered encounter medications on file as of 05/11/2013.     Orders Placed This Encounter  Procedures  . CBC with Differential  . Comprehensive metabolic panel  . Magnesium  . TSH + free T4  . Ferritin  . Iron and TIBC    No Follow-up on file.

## 2013-05-12 LAB — COMPREHENSIVE METABOLIC PANEL
AST: 15 U/L (ref 0–37)
Alkaline Phosphatase: 51 U/L (ref 39–117)
BUN: 17 mg/dL (ref 6–23)
Creat: 0.75 mg/dL (ref 0.50–1.10)

## 2013-05-12 LAB — TSH+FREE T4: Free T4: 0.77 ng/dL — ABNORMAL LOW (ref 0.82–1.77)

## 2013-05-12 LAB — IRON AND TIBC
Iron: 78 ug/dL (ref 42–145)
TIBC: 296 ug/dL (ref 250–470)
UIBC: 218 ug/dL (ref 125–400)

## 2013-05-12 LAB — FERRITIN: Ferritin: 99 ng/mL (ref 10–291)

## 2013-05-13 ENCOUNTER — Encounter: Payer: Self-pay | Admitting: Internal Medicine

## 2013-05-13 NOTE — Assessment & Plan Note (Addendum)
History suggests SVT or PAF vs hypoglycemic events.  Thyroid  Iron studies , CBC and ekg is normal.  Advised to reduce caffeine consumption and increase protein in diet, given serum glucose of 57

## 2013-06-12 ENCOUNTER — Other Ambulatory Visit (INDEPENDENT_AMBULATORY_CARE_PROVIDER_SITE_OTHER): Payer: TRICARE For Life (TFL)

## 2013-06-12 ENCOUNTER — Ambulatory Visit (INDEPENDENT_AMBULATORY_CARE_PROVIDER_SITE_OTHER): Payer: TRICARE For Life (TFL) | Admitting: *Deleted

## 2013-06-12 ENCOUNTER — Telehealth: Payer: Self-pay | Admitting: *Deleted

## 2013-06-12 DIAGNOSIS — E785 Hyperlipidemia, unspecified: Secondary | ICD-10-CM

## 2013-06-12 DIAGNOSIS — E162 Hypoglycemia, unspecified: Secondary | ICD-10-CM

## 2013-06-12 DIAGNOSIS — Z23 Encounter for immunization: Secondary | ICD-10-CM

## 2013-06-12 LAB — HEMOGLOBIN A1C: Hgb A1c MFr Bld: 5.4 % (ref 4.6–6.5)

## 2013-06-12 LAB — LIPID PANEL
Total CHOL/HDL Ratio: 4
Triglycerides: 81 mg/dL (ref 0.0–149.0)
VLDL: 16.2 mg/dL (ref 0.0–40.0)

## 2013-06-12 NOTE — Telephone Encounter (Signed)
Didn't order any, but if you can,  Draw  lipids and a1c.

## 2013-06-12 NOTE — Telephone Encounter (Signed)
What labs and dx?  

## 2013-06-13 LAB — LDL CHOLESTEROL, DIRECT: Direct LDL: 167.2 mg/dL

## 2013-06-14 ENCOUNTER — Encounter: Payer: Self-pay | Admitting: Internal Medicine

## 2013-06-15 ENCOUNTER — Encounter: Payer: Self-pay | Admitting: Internal Medicine

## 2013-07-23 ENCOUNTER — Encounter: Payer: Self-pay | Admitting: Internal Medicine

## 2013-07-23 DIAGNOSIS — R002 Palpitations: Secondary | ICD-10-CM

## 2013-07-23 DIAGNOSIS — Z803 Family history of malignant neoplasm of breast: Secondary | ICD-10-CM

## 2013-07-23 DIAGNOSIS — R42 Dizziness and giddiness: Secondary | ICD-10-CM

## 2013-07-23 DIAGNOSIS — Z1239 Encounter for other screening for malignant neoplasm of breast: Secondary | ICD-10-CM

## 2013-07-25 ENCOUNTER — Encounter: Payer: Self-pay | Admitting: Cardiovascular Disease

## 2013-07-25 ENCOUNTER — Ambulatory Visit (INDEPENDENT_AMBULATORY_CARE_PROVIDER_SITE_OTHER): Payer: TRICARE For Life (TFL) | Admitting: Cardiovascular Disease

## 2013-07-25 VITALS — BP 125/82 | HR 73 | Ht 67.0 in | Wt 187.5 lb

## 2013-07-25 DIAGNOSIS — R079 Chest pain, unspecified: Secondary | ICD-10-CM

## 2013-07-25 DIAGNOSIS — R0789 Other chest pain: Secondary | ICD-10-CM | POA: Insufficient documentation

## 2013-07-25 DIAGNOSIS — R011 Cardiac murmur, unspecified: Secondary | ICD-10-CM

## 2013-07-25 NOTE — Assessment & Plan Note (Signed)
She has a very soft benign sounding systolic murmur.  i doubt that she has any significant valvular disease.
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
## 2013-07-25 NOTE — Assessment & Plan Note (Signed)
Kelly Vaughn presents with some intermittent episodes of chest pressure.   She acknowledges that she is out of shape and that these may be just due to deconditioning.   Her ECG is essentially normal.    i have recommended that she start a walking program. If she is able to advance in her walking and not have any further episodes of chest pain I think it we can continue this  conservative approach.  On the other hand if she develops consistent chest pain or pressure while walking and is not able to advance in her exercise program then  I think that we should proceed with further stress testing and quite possibly other evaluation.

## 2013-07-25 NOTE — Patient Instructions (Signed)
No new medications.  No changes have been made.  Recommends for you to exercise on a regular basis or look into a exercise plan.  Follow up with Dr. Elease Hashimoto in 3 months.

## 2013-08-01 ENCOUNTER — Ambulatory Visit
Admission: RE | Admit: 2013-08-01 | Discharge: 2013-08-01 | Disposition: A | Discharge status: Home / Self Care | Source: Ambulatory Visit | Attending: Internal Medicine | Admitting: Internal Medicine

## 2013-08-01 DIAGNOSIS — Z1239 Encounter for other screening for malignant neoplasm of breast: Secondary | ICD-10-CM

## 2013-08-01 DIAGNOSIS — Z803 Family history of malignant neoplasm of breast: Secondary | ICD-10-CM

## 2013-08-01 MED ORDER — GADOBENATE DIMEGLUMINE 529 MG/ML IV SOLN
17.0000 mL | Freq: Once | INTRAVENOUS | Status: AC | PRN
  Administered 2013-08-01: 17 mL via INTRAVENOUS

## 2013-08-04 ENCOUNTER — Encounter: Payer: Self-pay | Admitting: Internal Medicine

## 2013-08-06 NOTE — Telephone Encounter (Signed)
Mailed unread message to pt  

## 2013-10-25 ENCOUNTER — Ambulatory Visit: Payer: TRICARE For Life (TFL) | Admitting: Cardiovascular Disease

## 2013-10-25 ENCOUNTER — Other Ambulatory Visit: Payer: Self-pay | Admitting: Internal Medicine

## 2013-10-26 NOTE — Telephone Encounter (Signed)
Ok refill? 

## 2013-11-23 ENCOUNTER — Other Ambulatory Visit: Payer: Self-pay | Admitting: Internal Medicine

## 2013-11-30 ENCOUNTER — Other Ambulatory Visit: Payer: Self-pay | Admitting: Internal Medicine

## 2013-12-03 ENCOUNTER — Ambulatory Visit (INDEPENDENT_AMBULATORY_CARE_PROVIDER_SITE_OTHER): Payer: TRICARE For Life (TFL) | Admitting: Internal Medicine

## 2013-12-03 ENCOUNTER — Encounter: Payer: Self-pay | Admitting: Internal Medicine

## 2013-12-03 VITALS — BP 138/80 | HR 84 | Temp 98.7°F | Resp 16 | Wt 193.2 lb

## 2013-12-03 DIAGNOSIS — R062 Wheezing: Secondary | ICD-10-CM

## 2013-12-03 DIAGNOSIS — R509 Fever, unspecified: Secondary | ICD-10-CM

## 2013-12-03 DIAGNOSIS — J111 Influenza due to unidentified influenza virus with other respiratory manifestations: Secondary | ICD-10-CM

## 2013-12-03 LAB — POCT RAPID STREP A (OFFICE): RAPID STREP A SCREEN: NEGATIVE

## 2013-12-03 LAB — POCT INFLUENZA A/B
Influenza A, POC: POSITIVE
Influenza B, POC: POSITIVE

## 2013-12-03 MED ORDER — ALBUTEROL SULFATE (2.5 MG/3ML) 0.083% IN NEBU
2.5000 mg | INHALATION_SOLUTION | Freq: Once | RESPIRATORY_TRACT | Status: AC
  Administered 2013-12-03: 2.5 mg via RESPIRATORY_TRACT

## 2013-12-03 MED ORDER — ALBUTEROL SULFATE HFA 108 (90 BASE) MCG/ACT IN AERS: 2.0000 | INHALATION_SPRAY | Freq: Four times a day (QID) | RESPIRATORY_TRACT | Status: DC | PRN

## 2013-12-03 MED ORDER — HYDROCOD POLST-CHLORPHEN POLST 10-8 MG/5ML PO LQCR: 5.0000 mL | Freq: Every evening | ORAL | Status: DC | PRN

## 2013-12-03 MED ORDER — PREDNISONE (PAK) 10 MG PO TABS: ORAL_TABLET | ORAL | Status: DC

## 2013-12-03 MED ORDER — METHYLPREDNISOLONE ACETATE 40 MG/ML IJ SUSP
40.0000 mg | Freq: Once | INTRAMUSCULAR | Status: AC
Start: 2013-12-03 — End: 2013-12-03
  Administered 2013-12-03: 40 mg via INTRAMUSCULAR

## 2013-12-03 MED ORDER — AMOXICILLIN-POT CLAVULANATE 875-125 MG PO TABS: 1.0000 | ORAL_TABLET | Freq: Two times a day (BID) | ORAL | Status: DC

## 2013-12-03 NOTE — Patient Instructions (Addendum)
You are suffering from bronchitis brought on by the flu.  You received an albuterol nebulizer and an injection of Depo Medrol (long acting steroid) in the office today   I will treat you  with the following:  1) Prednisone: 6 day tapering dose:   60 mg all at once starting tomorrow (Day 1)   Then 50 mg on day 2 , 40 mg on Day 3,  Etc (continue to taper by 1 tablet daily until gone)  2) Use your ProAir albuterol inhaler 2 puffs every 6 hours if needed for wheezing  3) tussionex for night time cough (controlled substance, has hydrocodone ),  Delsym for daytime cough   If no improvement in cough or sore throat in 24 to 48  hours,  Start the amoxicillin antibiotic  Please take a probiotic ( Align, Floraque or Culturelle) for 2 weeks if you start the antibiotic to prevent a serious antibiotic associated diarrhea  Called" clostridium dificile colitis" ( should also help prevent   vaginal yeast infection)
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
## 2013-12-04 ENCOUNTER — Encounter: Payer: Self-pay | Admitting: Internal Medicine

## 2013-12-04 DIAGNOSIS — J111 Influenza due to unidentified influenza virus with other respiratory manifestations: Secondary | ICD-10-CM | POA: Insufficient documentation

## 2013-12-04 MED ORDER — MOMETASONE FURO-FORMOTEROL FUM 200-5 MCG/ACT IN AERO: 2.0000 | INHALATION_SPRAY | Freq: Two times a day (BID) | RESPIRATORY_TRACT | Status: DC

## 2013-12-04 NOTE — Assessment & Plan Note (Signed)
Treating for bronchitis with wheezing on exam.  Adding abx if no imporvement in cough or throat in 24 to 48 hours.

## 2013-12-04 NOTE — Telephone Encounter (Signed)
Dulera samples set aside per patient  E mail

## 2013-12-07 ENCOUNTER — Other Ambulatory Visit: Payer: Self-pay | Admitting: Internal Medicine

## 2013-12-26 ENCOUNTER — Telehealth: Payer: Self-pay | Admitting: Internal Medicine

## 2013-12-26 DIAGNOSIS — Z79899 Other long term (current) drug therapy: Secondary | ICD-10-CM

## 2013-12-26 DIAGNOSIS — E538 Deficiency of other specified B group vitamins: Secondary | ICD-10-CM

## 2013-12-26 DIAGNOSIS — E039 Hypothyroidism, unspecified: Secondary | ICD-10-CM

## 2013-12-26 DIAGNOSIS — E559 Vitamin D deficiency, unspecified: Secondary | ICD-10-CM

## 2013-12-26 DIAGNOSIS — E785 Hyperlipidemia, unspecified: Secondary | ICD-10-CM

## 2013-12-26 NOTE — Telephone Encounter (Signed)
Pt scheduled cpe 5/5.  Wants labs done prior, scheduled 4/30.  Pt states she would like to have labs checked for:  Testosterone, thyroid, estrogen since started hormones, Vit D, iron, and anything else Dr. Derrel Nip wants to have checked.

## 2013-12-26 NOTE — Telephone Encounter (Signed)
Please advise 

## 2013-12-26 NOTE — Telephone Encounter (Signed)
I am happy to check thyroid, vitamin d but I will not check testosterone or estrogen bc there is no reason to.  I will not base any therapy on the results and it is a waste of money

## 2013-12-26 NOTE — Telephone Encounter (Signed)
Patient notified as requested. 

## 2013-12-31 ENCOUNTER — Encounter: Payer: Self-pay | Admitting: Internal Medicine

## 2014-01-05 ENCOUNTER — Other Ambulatory Visit: Payer: Self-pay | Admitting: Internal Medicine

## 2014-01-17 ENCOUNTER — Other Ambulatory Visit (INDEPENDENT_AMBULATORY_CARE_PROVIDER_SITE_OTHER): Payer: TRICARE For Life (TFL)

## 2014-01-17 ENCOUNTER — Encounter: Payer: Self-pay | Admitting: Internal Medicine

## 2014-01-17 DIAGNOSIS — E559 Vitamin D deficiency, unspecified: Secondary | ICD-10-CM

## 2014-01-17 DIAGNOSIS — E039 Hypothyroidism, unspecified: Secondary | ICD-10-CM

## 2014-01-17 DIAGNOSIS — E538 Deficiency of other specified B group vitamins: Secondary | ICD-10-CM

## 2014-01-17 DIAGNOSIS — E785 Hyperlipidemia, unspecified: Secondary | ICD-10-CM

## 2014-01-17 DIAGNOSIS — Z79899 Other long term (current) drug therapy: Secondary | ICD-10-CM

## 2014-01-17 LAB — CBC WITH DIFFERENTIAL/PLATELET
BASOS PCT: 0.4 % (ref 0.0–3.0)
Basophils Absolute: 0 10*3/uL (ref 0.0–0.1)
EOS ABS: 0.2 10*3/uL (ref 0.0–0.7)
Eosinophils Relative: 7.6 % — ABNORMAL HIGH (ref 0.0–5.0)
HCT: 41.5 % (ref 36.0–46.0)
HEMOGLOBIN: 13.8 g/dL (ref 12.0–15.0)
LYMPHS PCT: 25.4 % (ref 12.0–46.0)
Lymphs Abs: 0.8 10*3/uL (ref 0.7–4.0)
MCHC: 33.4 g/dL (ref 30.0–36.0)
MCV: 91.4 fl (ref 78.0–100.0)
MONOS PCT: 9.4 % (ref 3.0–12.0)
Monocytes Absolute: 0.3 10*3/uL (ref 0.1–1.0)
NEUTROS ABS: 1.8 10*3/uL (ref 1.4–7.7)
NEUTROS PCT: 57.2 % (ref 43.0–77.0)
Platelets: 151 10*3/uL (ref 150.0–400.0)
RBC: 4.54 Mil/uL (ref 3.87–5.11)
RDW: 14.6 % (ref 11.5–14.6)
WBC: 3.2 10*3/uL — AB (ref 4.5–10.5)

## 2014-01-17 LAB — VITAMIN B12: VITAMIN B 12: 621 pg/mL (ref 211–911)

## 2014-01-17 LAB — LIPID PANEL
Cholesterol: 235 mg/dL — ABNORMAL HIGH (ref 0–200)
HDL: 63.2 mg/dL (ref >39.00)
LDL Cholesterol: 155 mg/dL — ABNORMAL HIGH (ref 0–99)
Total CHOL/HDL Ratio: 4
Triglycerides: 82 mg/dL (ref 0.0–149.0)
VLDL: 16.4 mg/dL (ref 0.0–40.0)

## 2014-01-17 LAB — COMPREHENSIVE METABOLIC PANEL
ALT: 13 U/L (ref 0–35)
AST: 14 U/L (ref 0–37)
Albumin: 4.4 g/dL (ref 3.5–5.2)
Alkaline Phosphatase: 52 U/L (ref 39–117)
BILIRUBIN TOTAL: 0.4 mg/dL (ref 0.3–1.2)
BUN: 14 mg/dL (ref 6–23)
CO2: 31 meq/L (ref 19–32)
Calcium: 9.4 mg/dL (ref 8.4–10.5)
Chloride: 105 mEq/L (ref 96–112)
Creatinine, Ser: 0.7 mg/dL (ref 0.4–1.2)
GFR: 99.74 mL/min (ref >60.00)
Glucose, Bld: 90 mg/dL (ref 70–99)
Potassium: 4.3 mEq/L (ref 3.5–5.1)
SODIUM: 141 meq/L (ref 135–145)
Total Protein: 6.8 g/dL (ref 6.0–8.3)

## 2014-01-17 LAB — TSH: TSH: 1.49 u[IU]/mL (ref 0.35–5.50)

## 2014-01-18 LAB — VITAMIN D 25 HYDROXY (VIT D DEFICIENCY, FRACTURES): Vit D, 25-Hydroxy: 43 ng/mL (ref 30–89)

## 2014-01-22 ENCOUNTER — Encounter: Payer: TRICARE For Life (TFL) | Admitting: Internal Medicine

## 2014-01-29 ENCOUNTER — Other Ambulatory Visit: Payer: Self-pay | Admitting: Internal Medicine

## 2014-01-29 DIAGNOSIS — Z1231 Encounter for screening mammogram for malignant neoplasm of breast: Secondary | ICD-10-CM

## 2014-01-30 ENCOUNTER — Ambulatory Visit (INDEPENDENT_AMBULATORY_CARE_PROVIDER_SITE_OTHER): Admitting: Internal Medicine

## 2014-01-30 ENCOUNTER — Encounter: Payer: Self-pay | Admitting: Internal Medicine

## 2014-01-30 VITALS — BP 138/70 | HR 88 | Temp 98.5°F | Resp 16 | Ht 68.0 in | Wt 189.5 lb

## 2014-01-30 DIAGNOSIS — E785 Hyperlipidemia, unspecified: Secondary | ICD-10-CM

## 2014-01-30 DIAGNOSIS — Z0000 Encounter for general adult medical examination without abnormal findings: Secondary | ICD-10-CM

## 2014-01-30 DIAGNOSIS — E039 Hypothyroidism, unspecified: Secondary | ICD-10-CM

## 2014-01-30 DIAGNOSIS — R413 Other amnesia: Secondary | ICD-10-CM

## 2014-01-30 NOTE — Progress Notes (Signed)
Patient ID: Kelly Vaughn, female   DOB: Aug 29, 1956, 58 y.o.   MRN: [id number]   Subjective:     Kelly Vaughn is a 58 y.o. female and is here for a comprehensive physical exam. The patient reports with Concerns about AD due to short term memory deficits perceived by herself and husband for the last year.  Her mother developed dementia at age 24.  Husband wants her to see a neurologist  She can't  remember movies she saw  6 months ago.  Having trouble retaining the ability to learn new tasks.    Doesn't want to go back to work   .  History   Social History  . Marital Status: Married    Spouse Name: N/A    Number of Children: N/A  . Years of Education: N/A   Occupational History  . Not on file.   Social History Main Topics  . Smoking status: Never Smoker   . Smokeless tobacco: Never Used  . Alcohol Use: Yes     Comment: occassional  . Drug Use: No  . Sexual Activity: Not on file   Other Topics Concern  . Not on file   Social History Narrative  . No narrative on file   Health Maintenance  Topic Date Due  . Influenza Vaccine  04/20/2014  . Mammogram  02/20/2015  . Pap Smear  01/31/2016  . Colonoscopy  11/12/2020  . Tetanus/tdap  12/06/2022    The following portions of the patient's history were reviewed and updated as appropriate: allergies, current medications, past family history, past medical history, past social history, past surgical history and problem list.  Review of Systems A comprehensive review of systems was negative.   Objective:   BP 138/70  Pulse 88  Temp(Src) 98.5 F (36.9 C) (Oral)  Resp 16  Ht 5\' 8"  (1.727 m)  Wt 189 lb 8 oz (85.957 kg)  BMI 28.82 kg/m2  SpO2 98%  General appearance: alert, cooperative and appears stated age Ears: normal TM's and external ear canals both ears Throat: lips, mucosa, and tongue normal; teeth and gums normal Neck: no adenopathy, no carotid bruit, supple, symmetrical, trachea midline and thyroid not enlarged,  symmetric, no tenderness/mass/nodules Back: symmetric, no curvature. ROM normal. No CVA tenderness. Lungs: clear to auscultation bilaterally Heart: regular rate and rhythm, S1, S2 normal, no murmur, click, rub or gallop Abdomen: soft, non-tender; bowel sounds normal; no masses,  no organomegaly Pulses: 2+ and symmetric Skin: Skin color, texture, turgor normal. No rashes or lesions Lymph nodes: Cervical, supraclavicular, and axillary nodes normal. Neuro: CNs 2-12 intact. DTRs 2+/4 in biceps, brachioradialis, patellars and achilles. Muscle strength 5/5 in upper and lower exremities. Fine resting tremor bilaterally both hands cerebellar function normal. Romberg negative.  No pronator drift.   Gait normal.  MMSE:  patient scored 30/30.  clock drawing fine, was able to name 28 animals/1 minute and 16 words beginning with F.    Assessment and Plan:   Encounter for preventive health examination Annual comprehensive exam was done excluding breast, pelvic and PAP smear. All screenings have been addressed .   Memory deficits neurologic exam and MMSE were normal but she is highly intelligent,  educated , and  Has a family history of dementia.  Referral to [facility] memory clinic was discussed per patient request   Hyperlipidemia LDL and triglycerides are at goal without statins based on Framingham risk index  Lab Results  Component Value Date   CHOL 235* 01/17/2014   HDL  63.20 01/17/2014   LDLCALC 155* 01/17/2014   LDLDIRECT 167.2 06/12/2013   TRIG 82.0 01/17/2014   CHOLHDL 4 01/17/2014     Unspecified hypothyroidism Thyroid function is WNL on current dose.  No current changes needed.   Lab Results  Component Value Date   TSH 1.49 01/17/2014      Updated Medication List Outpatient Encounter Prescriptions as of 01/30/2014  Medication Sig  . Alpha-Lipoic Acid 300 MG CAPS Take 1 capsule by mouth daily.  Marland Kitchen buPROPion (WELLBUTRIN) 75 MG tablet Take 75 mg by mouth 2 (two) times daily.  .  [DISCONTINUED] buPROPion (WELLBUTRIN) 75 MG tablet TAKE 2 TABLETS DAILY  . albuterol (PROVENTIL HFA;VENTOLIN HFA) 108 (90 BASE) MCG/ACT inhaler Inhale 2 puffs into the lungs every 6 (six) hours as needed for wheezing or shortness of breath.  Marland Kitchen amoxicillin-clavulanate (AUGMENTIN) 875-125 MG per tablet Take 1 tablet by mouth 2 (two) times daily.  Francia Greaves THYROID 60 MG tablet TAKE 1 TABLET DAILY  . Ascorbic Acid (VITAMIN C) 1000 MG tablet Take 1,000 mg by mouth daily.  . Biotin 5000 MCG CAPS Take 1 capsule by mouth daily.  . Black Cohosh 540 MG CAPS Take 1 capsule by mouth daily.  . CELEBREX 200 MG capsule TAKE 1 CAPSULE TWICE DAILY  . celecoxib (CELEBREX) 200 MG capsule Take 200 mg by mouth as needed.  . cetirizine (ZYRTEC) 10 MG tablet Take 10 mg by mouth daily.  . CHELATED CALCIUM PO Take 1,200 mg by mouth daily.  . Chelated Zinc 50 MG TABS Take 1 tablet by mouth daily.  . chlorpheniramine-HYDROcodone (TUSSIONEX PENNKINETIC ER) 10-8 MG/5ML LQCR Take 5 mLs by mouth at bedtime as needed for cough.  . Cholecalciferol (VITAMIN D) 2000 UNITS CAPS Take 2 capsules by mouth daily.  . Chromium Picolinate 500 MCG TABS Take 1 tablet by mouth daily.  . Coenzyme Q10 (CO Q-10) 100 MG CAPS Take 1 capsule by mouth daily.  Marland Kitchen darifenacin (ENABLEX) 7.5 MG 24 hr tablet Take 1/2 tablet by mouth as needed  . diphenhydrAMINE (BENADRYL) 25 MG tablet Take 25 mg by mouth every 6 (six) hours as needed for itching.  . estradiol (ESTRACE) 0.5 MG tablet Take 0.5 mg by mouth daily.  . Estradiol (VAGIFEM) 10 MCG TABS Insert 1 tablet by vaginal route once daily for 14 days then 1 tablet 2 times per week for duration of use.  . Evening Primrose Oil 500 MG CAPS Take 1 capsule by mouth daily.  . folic acid (FOLVITE) 992 MCG tablet Take 400 mcg by mouth daily.  . Glucosamine-Chondroit-Vit C-Mn (GLUCOSAMINE CHONDR 1500 COMPLX PO) Take 1,500 mg by mouth daily.  . Lactobacillus (PROBIOTIC ACIDOPHILUS PO) Take 32 mg by mouth daily.   . mometasone-formoterol (DULERA) 200-5 MCG/ACT AERO Inhale 2 puffs into the lungs 2 (two) times daily.  . niacin (NIASPAN) 1000 MG CR tablet Take 1,000 mg by mouth at bedtime.  . NON FORMULARY Kyolic Garlic 4268 daily.  . OMEGA 3 1200 MG CAPS Take 2 capsules by mouth daily.  . Potassium 99 MG TABS Take 1 tablet by mouth daily.  . predniSONE (STERAPRED UNI-PAK) 10 MG tablet 6 tablets on Day 1 , then reduce by 1 tablet daily until gone  . progesterone (PROMETRIUM) 100 MG capsule Take 1 capsule (100 mg total) by mouth daily.  . QUEtiapine (SEROQUEL) 50 MG tablet TAKE 3 TABLETS (150 MG TOTAL) AT BEDTIME  . Red Yeast Rice Extract (RED YEAST RICE PO) Take 1,200 mg by mouth  daily.  . Selenium 200 MCG TABS Take 1 tablet by mouth daily.  . TURMERIC CURCUMIN PO Take 800 mg by mouth daily.  . vitamin B-12 (CYANOCOBALAMIN) 1000 MCG tablet Take 1,000 mcg by mouth daily.  . [DISCONTINUED] buPROPion (WELLBUTRIN) 75 MG tablet Take 2 tablets (150 mg total) by mouth daily.

## 2014-01-30 NOTE — Patient Instructions (Addendum)
Your memory was excellent by testing today; however, per your request, I am referring you to a neurologist for an evaluation of your memory  Kelly Vaughn will call you with the referral information

## 2014-02-02 DIAGNOSIS — R413 Other amnesia: Secondary | ICD-10-CM | POA: Insufficient documentation

## 2014-02-02 DIAGNOSIS — Z0000 Encounter for general adult medical examination without abnormal findings: Secondary | ICD-10-CM | POA: Insufficient documentation

## 2014-02-02 NOTE — Assessment & Plan Note (Signed)
neurologic exam and MMSE were normal but she is highly intelligent,  educated , and  Has a family history of dementia.  Referral to [facility] memory clinic was discussed per patient request

## 2014-02-02 NOTE — Assessment & Plan Note (Signed)
Thyroid function is WNL on current dose.  No current changes needed.   Lab Results  Component Value Date   TSH 1.49 01/17/2014

## 2014-02-02 NOTE — Assessment & Plan Note (Signed)
Annual comprehensive exam was done excluding breast, pelvic and PAP smear. All screenings have been addressed .  

## 2014-02-02 NOTE — Assessment & Plan Note (Signed)
LDL and triglycerides are at goal without statins based on Framingham risk index  Lab Results  Component Value Date   CHOL 235* 01/17/2014   HDL 63.20 01/17/2014   LDLCALC 155* 01/17/2014   LDLDIRECT 167.2 06/12/2013   TRIG 82.0 01/17/2014   CHOLHDL 4 01/17/2014

## 2014-02-12 ENCOUNTER — Ambulatory Visit (INDEPENDENT_AMBULATORY_CARE_PROVIDER_SITE_OTHER): Payer: TRICARE For Life (TFL) | Admitting: Adult Health

## 2014-02-12 ENCOUNTER — Encounter: Payer: Self-pay | Admitting: Adult Health

## 2014-02-12 ENCOUNTER — Other Ambulatory Visit: Payer: Self-pay | Admitting: Internal Medicine

## 2014-02-12 VITALS — BP 112/68 | HR 85 | Temp 98.4°F | Resp 16 | Ht 68.0 in | Wt 189.8 lb

## 2014-02-12 DIAGNOSIS — R238 Other skin changes: Secondary | ICD-10-CM

## 2014-02-12 DIAGNOSIS — R21 Rash and other nonspecific skin eruption: Secondary | ICD-10-CM

## 2014-02-12 MED ORDER — METHYLPREDNISOLONE ACETATE 40 MG/ML IJ SUSP
40.0000 mg | Freq: Once | INTRAMUSCULAR | Status: AC
  Administered 2014-02-12: 40 mg via INTRAMUSCULAR

## 2014-02-12 NOTE — Progress Notes (Signed)
Pre visit review using our clinic review tool, if applicable. No additional management support is needed unless otherwise documented below in the visit note. 

## 2014-02-12 NOTE — Progress Notes (Signed)
Patient ID: Kelly Vaughn, female   DOB: 11-06-1955, 58 y.o.   MRN: [id number]    Subjective:    Patient ID: Kelly Vaughn, female    DOB: 23-Aug-1956, 58 y.o.   MRN: [id number]  HPI  Pt presents with multiple, scatters areas of papules and vesicles that developed over the weekend. At first she thought it was shingles because she first noticed a few vesicles; however, they are not painful. She reports these vesicles are itchy. She has developed multiple areas on her abdomen, arms, back. There are a few on her hips. No fever, respiratory symptoms or fatigue. The only thing she has done differently is get in her hot tube. She reports that they keep this hot tube very clean. She reports this same thing occurred several years ago. At that time, she was told it was related to her hot tub.   Past Medical History  Diagnosis Date  . allergic rhinitis   . Depression   . Eating disorder   . Headache disorder   . GERD (gastroesophageal reflux disease)   . Hyperlipidemia   . hypothyroidism   . Urinary incontinence   . Hx of colonic polyps   . Basal cell carcinoma     Current Outpatient Prescriptions on File Prior to Visit  Medication Sig Dispense Refill  . Alpha-Lipoic Acid 300 MG CAPS Take 1 capsule by mouth daily.      Francia Greaves THYROID 60 MG tablet TAKE 1 TABLET DAILY  90 tablet  1  . Ascorbic Acid (VITAMIN C) 1000 MG tablet Take 1,000 mg by mouth daily.      . Biotin 5000 MCG CAPS Take 1 capsule by mouth daily.      . celecoxib (CELEBREX) 200 MG capsule Take 200 mg by mouth as needed.      . CHELATED CALCIUM PO Take 1,200 mg by mouth daily.      . Chelated Zinc 50 MG TABS Take 1 tablet by mouth daily.      . chlorpheniramine-HYDROcodone (TUSSIONEX PENNKINETIC ER) 10-8 MG/5ML LQCR Take 5 mLs by mouth at bedtime as needed for cough.  180 mL  0  . Cholecalciferol (VITAMIN D) 2000 UNITS CAPS Take 2 capsules by mouth daily.      . Chromium Picolinate 500 MCG TABS Take 1 tablet by mouth daily.       . Coenzyme Q10 (CO Q-10) 100 MG CAPS Take 1 capsule by mouth daily.      Marland Kitchen darifenacin (ENABLEX) 7.5 MG 24 hr tablet Take 1/2 tablet by mouth as needed      . diphenhydrAMINE (BENADRYL) 25 MG tablet Take 25 mg by mouth every 6 (six) hours as needed for itching.      . estradiol (ESTRACE) 0.5 MG tablet Take 0.5 mg by mouth daily.      . Estradiol (VAGIFEM) 10 MCG TABS Insert 1 tablet by vaginal route once daily for 14 days then 1 tablet 2 times per week for duration of use.      . Evening Primrose Oil 500 MG CAPS Take 1 capsule by mouth daily.      . folic acid (FOLVITE) 160 MCG tablet Take 400 mcg by mouth daily.      . Glucosamine-Chondroit-Vit C-Mn (GLUCOSAMINE CHONDR 1500 COMPLX PO) Take 1,500 mg by mouth daily.      . Lactobacillus (PROBIOTIC ACIDOPHILUS PO) Take 32 mg by mouth daily.      . mometasone-formoterol (DULERA) 200-5 MCG/ACT AERO Inhale 2 puffs into the  lungs 2 (two) times daily.  8.8 g  0  . niacin (NIASPAN) 1000 MG CR tablet Take 1,000 mg by mouth at bedtime.      . NON FORMULARY Kyolic Garlic 6568 daily.      . OMEGA 3 1200 MG CAPS Take 2 capsules by mouth daily.      . Potassium 99 MG TABS Take 1 tablet by mouth daily.      . predniSONE (STERAPRED UNI-PAK) 10 MG tablet 6 tablets on Day 1 , then reduce by 1 tablet daily until gone  21 tablet  0  . progesterone (PROMETRIUM) 100 MG capsule Take 1 capsule (100 mg total) by mouth daily.  90 capsule  3  . QUEtiapine (SEROQUEL) 50 MG tablet TAKE 3 TABLETS (150 MG TOTAL) AT BEDTIME  270 tablet  0  . Red Yeast Rice Extract (RED YEAST RICE PO) Take 1,200 mg by mouth daily.      . Selenium 200 MCG TABS Take 1 tablet by mouth daily.      . TURMERIC CURCUMIN PO Take 800 mg by mouth daily.      . vitamin B-12 (CYANOCOBALAMIN) 1000 MCG tablet Take 1,000 mcg by mouth daily.       No current facility-administered medications on file prior to visit.     Review of Systems  Constitutional: Negative.   HENT: Negative.   Eyes: Negative.    Respiratory: Negative.   Cardiovascular: Negative.   Gastrointestinal: Negative.   Endocrine: Negative.   Genitourinary: Negative.   Musculoskeletal: Negative.   Skin: Positive for rash.       Itchy papules and vesicles on her abdomen, back and arms  Allergic/Immunologic: Negative.   Neurological: Negative.   Hematological: Negative.   Psychiatric/Behavioral: Negative.        Objective:  BP 112/68  Pulse 85  Temp(Src) 98.4 F (36.9 C) (Oral)  Resp 16  Ht 5\' 8"  (1.727 m)  Wt 189 lb 12 oz (86.07 kg)  BMI 28.86 kg/m2  SpO2 97%   Physical Exam  Constitutional: She is oriented to person, place, and time. She appears well-developed and well-nourished. No distress.  HENT:  Head: Normocephalic and atraumatic.  Eyes: Conjunctivae and EOM are normal.  Neck: Normal range of motion. Neck supple.  Cardiovascular: Normal rate and regular rhythm.   Pulmonary/Chest: Effort normal. No respiratory distress.  Musculoskeletal: Normal range of motion.  Neurological: She is alert and oriented to person, place, and time. She has normal reflexes.  Skin: Rash noted.  Vesicular rash throughout abdomen, back and arms. There are a few areas on her hips.   Psychiatric: She has a normal mood and affect. Her behavior is normal. Judgment and thought content normal.      Assessment & Plan:   1. Vesicular rash She has been taking valtrex. Depo medrol 40 mg IM given in clinic. Continue calamine lotion. Take benadryl and zantac as directed. Call if develops fever or other symptoms.

## 2014-02-12 NOTE — Patient Instructions (Signed)
  Continue to use over-the-counter calamine lotion.  Take Benadryl 12.5 mg to 25 mg every 8 hours for the next 4-5 days. Benadryl may cause sedation. If this is too sedating then try taking Allegra. Also take Zantac 150 mg twice a day for the next 4-5 days.  He received a steroid injection in clinic. If your symptoms worsen please call the office.

## 2014-02-13 ENCOUNTER — Encounter: Payer: Self-pay | Admitting: Internal Medicine

## 2014-02-13 DIAGNOSIS — R419 Unspecified symptoms and signs involving cognitive functions and awareness: Secondary | ICD-10-CM | POA: Insufficient documentation

## 2014-02-15 ENCOUNTER — Telehealth: Payer: Self-pay | Admitting: *Deleted

## 2014-02-15 DIAGNOSIS — R4189 Other symptoms and signs involving cognitive functions and awareness: Secondary | ICD-10-CM

## 2014-02-15 NOTE — Telephone Encounter (Signed)
Patient stated she was seen by Neurology at [facility] and a MRI was ordered patient wants to have MRI done here and is dropping off referral from [facility] and wanted to know if we can arrange? Please advise?

## 2014-02-15 NOTE — Telephone Encounter (Signed)
Yes i can arrange but not until i am done seeing patients send back to me for reminder

## 2014-02-17 NOTE — Telephone Encounter (Signed)
I cannot order the MRi without knowing whether [facility] wanted it with and without contrast or just without contrast.  There was nothing in my folders to confirm

## 2014-02-18 MED ORDER — TRAZODONE HCL 50 MG PO TABS: 50.0000 mg | ORAL_TABLET | Freq: Every day | ORAL | Status: DC

## 2014-02-18 NOTE — Telephone Encounter (Signed)
Patient is to bring copy of the referral for MRI from [place] patient stated that it is with and without contrast.

## 2014-02-19 NOTE — Telephone Encounter (Signed)
MRi [facility] with and without contrast ordered

## 2014-02-19 NOTE — Telephone Encounter (Signed)
Sent mychart message, notifying pt MRI has been ordered

## 2014-02-22 ENCOUNTER — Ambulatory Visit (HOSPITAL_COMMUNITY)
Admission: RE | Admit: 2014-02-22 | Discharge: 2014-02-22 | Disposition: A | Discharge status: Home / Self Care | Payer: TRICARE For Life (TFL) | Source: Ambulatory Visit | Attending: Internal Medicine | Admitting: Internal Medicine

## 2014-02-22 ENCOUNTER — Other Ambulatory Visit: Payer: Self-pay | Admitting: Internal Medicine

## 2014-02-22 DIAGNOSIS — Z1231 Encounter for screening mammogram for malignant neoplasm of breast: Secondary | ICD-10-CM

## 2014-02-24 ENCOUNTER — Encounter: Payer: Self-pay | Admitting: Internal Medicine

## 2014-02-24 DIAGNOSIS — R921 Mammographic calcification found on diagnostic imaging of breast: Secondary | ICD-10-CM

## 2014-02-25 ENCOUNTER — Other Ambulatory Visit: Payer: Self-pay | Admitting: Internal Medicine

## 2014-02-25 DIAGNOSIS — R928 Other abnormal and inconclusive findings on diagnostic imaging of breast: Secondary | ICD-10-CM

## 2014-02-26 ENCOUNTER — Telehealth: Payer: Self-pay | Admitting: Internal Medicine

## 2014-02-26 NOTE — Telephone Encounter (Signed)
When i called patient concerning her mammogram patient stated she was following directions to discontinue Seroquel, but stated that she is experiencing nausea, nervousness and some loose stool feels she is having withdrawals please advise,

## 2014-02-27 ENCOUNTER — Other Ambulatory Visit: Payer: Self-pay | Admitting: Internal Medicine

## 2014-02-27 ENCOUNTER — Other Ambulatory Visit: Payer: Self-pay | Admitting: *Deleted

## 2014-02-27 DIAGNOSIS — R921 Mammographic calcification found on diagnostic imaging of breast: Secondary | ICD-10-CM

## 2014-02-27 NOTE — Telephone Encounter (Signed)
i cannot manage this over the phone. She will need to be seen

## 2014-02-27 NOTE — Telephone Encounter (Signed)
Patient stated her sister and niece are MD and was advised to start back Seroquel 25 mg stated she feels better at this time.

## 2014-03-06 ENCOUNTER — Ambulatory Visit
Admission: RE | Admit: 2014-03-06 | Discharge: 2014-03-06 | Disposition: A | Discharge status: Home / Self Care | Source: Ambulatory Visit | Attending: Internal Medicine | Admitting: Internal Medicine

## 2014-03-06 DIAGNOSIS — R4189 Other symptoms and signs involving cognitive functions and awareness: Secondary | ICD-10-CM

## 2014-03-06 MED ORDER — GADOBENATE DIMEGLUMINE 529 MG/ML IV SOLN
17.0000 mL | Freq: Once | INTRAVENOUS | Status: AC | PRN
  Administered 2014-03-06: 17 mL via INTRAVENOUS

## 2014-03-07 ENCOUNTER — Encounter: Payer: Self-pay | Admitting: Internal Medicine

## 2014-03-18 ENCOUNTER — Ambulatory Visit
Admission: RE | Admit: 2014-03-18 | Discharge: 2014-03-18 | Disposition: A | Discharge status: Home / Self Care | Source: Ambulatory Visit | Attending: Internal Medicine | Admitting: Internal Medicine

## 2014-03-18 DIAGNOSIS — R921 Mammographic calcification found on diagnostic imaging of breast: Secondary | ICD-10-CM

## 2014-03-18 MED ORDER — GADOBENATE DIMEGLUMINE 529 MG/ML IV SOLN
17.0000 mL | Freq: Once | INTRAVENOUS | Status: AC | PRN
  Administered 2014-03-18: 17 mL via INTRAVENOUS

## 2014-03-20 ENCOUNTER — Other Ambulatory Visit: Payer: Self-pay | Admitting: Internal Medicine

## 2014-03-25 ENCOUNTER — Other Ambulatory Visit: Payer: Self-pay | Admitting: Internal Medicine

## 2014-03-25 ENCOUNTER — Ambulatory Visit
Admission: RE | Admit: 2014-03-25 | Discharge: 2014-03-25 | Disposition: A | Discharge status: Home / Self Care | Source: Ambulatory Visit | Attending: Internal Medicine | Admitting: Internal Medicine

## 2014-03-25 DIAGNOSIS — R928 Other abnormal and inconclusive findings on diagnostic imaging of breast: Secondary | ICD-10-CM

## 2014-03-25 DIAGNOSIS — R921 Mammographic calcification found on diagnostic imaging of breast: Secondary | ICD-10-CM

## 2014-03-26 ENCOUNTER — Encounter: Payer: Self-pay | Admitting: Internal Medicine

## 2014-03-26 DIAGNOSIS — R928 Other abnormal and inconclusive findings on diagnostic imaging of breast: Secondary | ICD-10-CM | POA: Insufficient documentation

## 2014-03-26 DIAGNOSIS — R921 Mammographic calcification found on diagnostic imaging of breast: Secondary | ICD-10-CM | POA: Insufficient documentation

## 2014-04-02 ENCOUNTER — Ambulatory Visit
Admission: RE | Admit: 2014-04-02 | Discharge: 2014-04-02 | Disposition: A | Discharge status: Home / Self Care | Source: Ambulatory Visit | Attending: Internal Medicine | Admitting: Internal Medicine

## 2014-04-02 DIAGNOSIS — R921 Mammographic calcification found on diagnostic imaging of breast: Secondary | ICD-10-CM

## 2014-04-03 ENCOUNTER — Other Ambulatory Visit: Payer: Self-pay | Admitting: Internal Medicine

## 2014-04-03 ENCOUNTER — Telehealth: Payer: Self-pay | Admitting: Internal Medicine

## 2014-04-03 ENCOUNTER — Ambulatory Visit
Admission: RE | Admit: 2014-04-03 | Discharge: 2014-04-03 | Disposition: A | Discharge status: Home / Self Care | Source: Ambulatory Visit | Attending: Internal Medicine | Admitting: Internal Medicine

## 2014-04-03 DIAGNOSIS — R921 Mammographic calcification found on diagnostic imaging of breast: Secondary | ICD-10-CM

## 2014-04-03 NOTE — Telephone Encounter (Signed)
The patient is having problems sleeping after a change in her medication form Seroquel to trazodone 50mg . The patient is also experiencing anxiety . The patient has been scheduled for 7.27.15.

## 2014-04-04 NOTE — Telephone Encounter (Signed)
Spoke with pt advised of MDs message.  Verbalized understanding. 

## 2014-04-04 NOTE — Telephone Encounter (Signed)
In the interim, she can try increasing the trazodone  To the next dose which is either 50 mg or 100 mg for her insomnia

## 2014-04-04 NOTE — Telephone Encounter (Signed)
Left message for pt to return call.

## 2014-04-09 ENCOUNTER — Ambulatory Visit (INDEPENDENT_AMBULATORY_CARE_PROVIDER_SITE_OTHER): Admitting: Adult Health

## 2014-04-09 ENCOUNTER — Encounter: Payer: Self-pay | Admitting: Adult Health

## 2014-04-09 VITALS — BP 108/60 | HR 68 | Temp 98.5°F | Resp 14 | Wt 188.8 lb

## 2014-04-09 DIAGNOSIS — N644 Mastodynia: Secondary | ICD-10-CM

## 2014-04-09 NOTE — Progress Notes (Signed)
Patient ID: Kelly Vaughn, female   DOB: Jul 05, 1956, 58 y.o.   MRN: [id number]   Subjective:    Patient ID: Kelly Vaughn, female    DOB: September 13, 1956, 58 y.o.   MRN: [id number]  HPI  Pt is a pleasant 58 y/o female who presents to clinic s/p biopsy of the left breast x 2 at [facility] She reports having first biopsy on Monday and then being called in for second bx on Tuesday. She is c/o pain in the left breast and is concerned about mastitis.  Past Medical History  Diagnosis Date  . allergic rhinitis   . Depression   . Eating disorder   . Headache disorder   . GERD (gastroesophageal reflux disease)   . Hyperlipidemia   . hypothyroidism   . Urinary incontinence   . Hx of colonic polyps   . Basal cell carcinoma     Current Outpatient Prescriptions on File Prior to Visit  Medication Sig Dispense Refill  . Alpha-Lipoic Acid 300 MG CAPS Take 1 capsule by mouth daily.      Francia Greaves THYROID 60 MG tablet TAKE 1 TABLET DAILY  90 tablet  1  . Ascorbic Acid (VITAMIN C) 1000 MG tablet Take 1,000 mg by mouth daily.      . Biotin 5000 MCG CAPS Take 1 capsule by mouth daily.      Marland Kitchen buPROPion (WELLBUTRIN) 75 MG tablet Take 1 tablet (75 mg total) by mouth 2 (two) times daily.  180 tablet  0  . celecoxib (CELEBREX) 200 MG capsule Take 200 mg by mouth as needed.      . CHELATED CALCIUM PO Take 1,200 mg by mouth daily.      . Chelated Zinc 50 MG TABS Take 1 tablet by mouth daily.      . Cholecalciferol (VITAMIN D) 2000 UNITS CAPS Take 2 capsules by mouth daily.      . Chromium Picolinate 500 MCG TABS Take 1 tablet by mouth daily.      . Coenzyme Q10 (CO Q-10) 100 MG CAPS Take 1 capsule by mouth daily.      Marland Kitchen estradiol (ESTRACE) 0.5 MG tablet Take 0.5 mg by mouth daily.      . Estradiol (VAGIFEM) 10 MCG TABS Insert 1 tablet by vaginal route once daily for 14 days then 1 tablet 2 times per week for duration of use.      . Evening Primrose Oil 500 MG CAPS Take 1 capsule by mouth daily.        . folic acid (FOLVITE) 606 MCG tablet Take 400 mcg by mouth daily.      . Glucosamine-Chondroit-Vit C-Mn (GLUCOSAMINE CHONDR 1500 COMPLX PO) Take 1,500 mg by mouth daily.      . Lactobacillus (PROBIOTIC ACIDOPHILUS PO) Take 32 mg by mouth daily.      . niacin (NIASPAN) 1000 MG CR tablet Take 1,000 mg by mouth at bedtime.      . NON FORMULARY Kyolic Garlic 3016 daily.      . OMEGA 3 1200 MG CAPS Take 2 capsules by mouth daily.      . Potassium 99 MG TABS Take 1 tablet by mouth daily.      . progesterone (PROMETRIUM) 100 MG capsule Take 1 capsule (100 mg total) by mouth daily.  90 capsule  3  . Red Yeast Rice Extract (RED YEAST RICE PO) Take 1,200 mg by mouth daily.      . Selenium 200 MCG TABS  Take 1 tablet by mouth daily.      . TURMERIC CURCUMIN PO Take 800 mg by mouth daily.      . valACYclovir (VALTREX) 1000 MG tablet Take 1,000 mg by mouth 3 (three) times daily.       No current facility-administered medications on file prior to visit.     Review of Systems  Positive for pain, swelling and bruising of the left breast Negative for drainage, redness, fever or chills All other systems negative    Objective:  BP 108/60  Pulse 68  Temp(Src) 98.5 F (36.9 C) (Oral)  Resp 14  Wt 188 lb 12 oz (85.616 kg)  SpO2 98%   Physical Exam  Left breast with degrees of healing bruising. No erythema, warmth, drainage or skin changes noted of the left breast. Positive for trauma from biopsy.      Assessment & Plan:   1. Breast pain in female No s/s of mastitis noted. Trauma from breast bx x 2 within 2 days (s/p 1 week ago). Apply ice to the area for 20 min. May alternate with heat. Gently massage age. RTC if any erythema, pus drainage from nipple, fever or chills develop.

## 2014-04-09 NOTE — Progress Notes (Signed)
Pre visit review using our clinic review tool, if applicable. No additional management support is needed unless otherwise documented below in the visit note. 

## 2014-04-15 ENCOUNTER — Ambulatory Visit (INDEPENDENT_AMBULATORY_CARE_PROVIDER_SITE_OTHER): Admitting: Internal Medicine

## 2014-04-15 ENCOUNTER — Encounter: Payer: Self-pay | Admitting: Internal Medicine

## 2014-04-15 VITALS — BP 104/60 | HR 70 | Temp 98.4°F | Resp 16 | Ht 68.0 in | Wt 188.5 lb

## 2014-04-15 DIAGNOSIS — N951 Menopausal and female climacteric states: Secondary | ICD-10-CM

## 2014-04-15 DIAGNOSIS — R928 Other abnormal and inconclusive findings on diagnostic imaging of breast: Secondary | ICD-10-CM

## 2014-04-15 DIAGNOSIS — R921 Mammographic calcification found on diagnostic imaging of breast: Secondary | ICD-10-CM

## 2014-04-15 DIAGNOSIS — F5105 Insomnia due to other mental disorder: Secondary | ICD-10-CM

## 2014-04-15 DIAGNOSIS — R413 Other amnesia: Secondary | ICD-10-CM

## 2014-04-15 DIAGNOSIS — F489 Nonpsychotic mental disorder, unspecified: Secondary | ICD-10-CM

## 2014-04-15 NOTE — Patient Instructions (Signed)
I agree with continuing  100 mg trazodone for now and adding regular exercise for the mind and body benefits .  When you decide to stop the estradiol,  Taper it off over 4 weeks  Insomnia Insomnia is frequent trouble falling and/or staying asleep. Insomnia can be a long term problem or a short term problem. Both are common. Insomnia can be a short term problem when the wakefulness is related to a certain stress or worry. Long term insomnia is often related to ongoing stress during waking hours and/or poor sleeping habits. Overtime, sleep deprivation itself can make the problem worse. Every little thing feels more severe because you are overtired and your ability to cope is decreased. CAUSES   Stress, anxiety, and depression.  Poor sleeping habits.  Distractions such as TV in the bedroom.  Naps close to bedtime.  Engaging in emotionally charged conversations before bed.  Technical reading before sleep.  Alcohol and other sedatives. They may make the problem worse. They can hurt normal sleep patterns and normal dream activity.  Stimulants such as caffeine for several hours prior to bedtime.  Pain syndromes and shortness of breath can cause insomnia.  Exercise late at night.  Changing time zones may cause sleeping problems (jet lag). It is sometimes helpful to have someone observe your sleeping patterns. They should look for periods of not breathing during the night (sleep apnea). They should also look to see how long those periods last. If you live alone or observers are uncertain, you can also be observed at a sleep clinic where your sleep patterns will be professionally monitored. Sleep apnea requires a checkup and treatment. Give your caregivers your medical history. Give your caregivers observations your family has made about your sleep.  SYMPTOMS   Not feeling rested in the morning.  Anxiety and restlessness at bedtime.  Difficulty falling and staying asleep. TREATMENT    Your caregiver may prescribe treatment for an underlying medical disorders. Your caregiver can give advice or help if you are using alcohol or other drugs for self-medication. Treatment of underlying problems will usually eliminate insomnia problems.  Medications can be prescribed for short time use. They are generally not recommended for lengthy use.  Over-the-counter sleep medicines are not recommended for lengthy use. They can be habit forming.  You can promote easier sleeping by making lifestyle changes such as:  Using relaxation techniques that help with breathing and reduce muscle tension.  Exercising earlier in the day.  Changing your diet and the time of your last meal. No night time snacks.  Establish a regular time to go to bed.  Counseling can help with stressful problems and worry.  Soothing music and white noise may be helpful if there are background noises you cannot remove.  Stop tedious detailed work at least one hour before bedtime. HOME CARE INSTRUCTIONS   Keep a diary. Inform your caregiver about your progress. This includes any medication side effects. See your caregiver regularly. Take note of:  Times when you are asleep.  Times when you are awake during the night.  The quality of your sleep.  How you feel the next day. This information will help your caregiver care for you.  Get out of bed if you are still awake after 15 minutes. Read or do some quiet activity. Keep the lights down. Wait until you feel sleepy and go back to bed.  Keep regular sleeping and waking hours. Avoid naps.  Exercise regularly.  Avoid distractions at bedtime. Distractions include  watching television or engaging in any intense or detailed activity like attempting to balance the household checkbook.  Develop a bedtime ritual. Keep a familiar routine of bathing, brushing your teeth, climbing into bed at the same time each night, listening to soothing music. Routines increase the  success of falling to sleep faster.  Use relaxation techniques. This can be using breathing and muscle tension release routines. It can also include visualizing peaceful scenes. You can also help control troubling or intruding thoughts by keeping your mind occupied with boring or repetitive thoughts like the old concept of counting sheep. You can make it more creative like imagining planting one beautiful flower after another in your backyard garden.  During your day, work to eliminate stress. When this is not possible use some of the previous suggestions to help reduce the anxiety that accompanies stressful situations. MAKE SURE YOU:   Understand these instructions.  Will watch your condition.  Will get help right away if you are not doing well or get worse. Document Released: 09/03/2000 Document Revised: 11/29/2011 Document Reviewed: 10/04/2007 [facility] Patient Information 2015 [place], [place]. This information is not intended to replace advice given to you by your health care provider. Make sure you discuss any questions you have with your health care provider.

## 2014-04-15 NOTE — Progress Notes (Signed)
Patient ID: Kelly Vaughn, female   DOB: Aug 17, 1956, 58 y.o.   MRN: [id number]   Patient Active Problem List   Diagnosis Date Noted  . 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 Breast calcifications on mammogram 03/26/2014  . Encounter for preventive health examination 02/02/2014  . Memory deficits 02/02/2014  . Murmur, heart 05/11/2013  . Palpitations 05/11/2013  . Unspecified hypothyroidism 11/12/2012  . Menopausal syndrome 11/12/2012  . Left knee DJD 11/12/2012  . History of whiplash injury 11/12/2012  . allergic rhinitis   . Bipolar I disorder, most recent episode depressed   . Obesity, unspecified   . Headache disorder   . GERD (gastroesophageal reflux disease)   . Hyperlipidemia   . Urinary incontinence   . Hx of colonic polyps   . Basal cell carcinoma     Subjective:  CC:   Chief Complaint  Patient presents with  . Anxiety  . Insomnia    HPI:   Kelly Vaughn is a 58 y.o. female who presents for Follow up on insomnia and breast pain post stererotactic breast  Biopsy x 2 done by [place].    Hx: June tried going off seroquel with a 2 week taper .  Did not tolerate the taper  Due to onset of malaise,  dizziness, and itching,  So she slowed the taper to a 5 week process and is currently off it  without consequences.  Husband has noticed an improvement in energy level,  Speech and language.   Has tried trazodone without relief of insomnia at 50 mg daily x 3 weeks,  But was only averaging 3 hrs of sleep per night.  Increased dose to 75 mg daily for 2 weeks,  Slight improvement .  Has increasd it to 100 mg 4 days ago and is now getting  6 hours of sleep (used to having 9 hours ).  She has not availed herself of nonpharmacologic methods of managing insomnia but plans to start exercising soon.  Feels a little dizzy at night   Feel she is more irritable on current regimen .        Past Medical History  Diagnosis Date  . allergic rhinitis   .  Depression   . Eating disorder   . Headache disorder   . GERD (gastroesophageal reflux disease)   . Hyperlipidemia   . hypothyroidism   . Urinary incontinence   . Hx of colonic polyps   . Basal cell carcinoma     Past Surgical History  Procedure Laterality Date  . Cesarean section  1985  and 1989  . Cholecystectomy  2010  . Breast biopsy  2012  . Tonsillectomy and adenoidectomy  1960  . Cervical ablation         The following portions of the patient's history were reviewed and updated as appropriate: Allergies, current medications, and problem list.    Review of Systems:   Patient denies headache, fevers, malaise, unintentional weight loss, skin rash, eye pain, sinus congestion and sinus pain, sore throat, dysphagia,  hemoptysis , cough, dyspnea, wheezing, chest pain, palpitations, orthopnea, edema, abdominal pain, nausea, melena, diarrhea, constipation, flank pain, dysuria, hematuria, urinary  Frequency, nocturia, numbness, tingling, seizures,  Focal weakness, Loss of consciousness,  Tremor, insomnia, depression, anxiety, and suicidal ideation.     History   Social History  . Marital Status: Married    Spouse Name: N/A    Number of Children: N/A  . Years of Education: N/A  Occupational History  . Not on file.   Social History Main Topics  . Smoking status: Never Smoker   . Smokeless tobacco: Never Used  . Alcohol Use: Yes     Comment: occassional  . Drug Use: No  . Sexual Activity: Not on file   Other Topics Concern  . Not on file   Social History Narrative  . No narrative on file    Objective:  Filed Vitals:   04/15/14 0927  BP: 104/60  Pulse: 70  Temp: 98.4 F (36.9 C)  Resp: 16     General appearance: alert, cooperative and appears stated age Ears: normal TM's and external ear canals both ears Throat: lips, mucosa, and tongue normal; teeth and gums normal Neck: no adenopathy, no carotid bruit, supple, symmetrical, trachea midline and  thyroid not enlarged, symmetric, no tenderness/mass/nodules Back: symmetric, no curvature. ROM normal. No CVA tenderness. Lungs: clear to auscultation bilaterally Heart: regular rate and rhythm, S1, S2 normal, no murmur, click, rub or gallop Abdomen: soft, non-tender; bowel sounds normal; no masses,  no organomegaly Pulses: 2+ and symmetric Skin: Skin color, texture, turgor normal. No rashes or lesions Lymph nodes: Cervical, supraclavicular, and axillary nodes normal.  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   Menopausal syndrome Managed with estradiol, which she is considering stopping given her recent breast biopsy   Updated Medication List Outpatient Encounter Prescriptions as of 04/15/2014  Medication Sig  . Alpha-Lipoic Acid 300 MG CAPS Take 1 capsule by mouth daily.  Francia Greaves THYROID 60 MG tablet TAKE 1 TABLET DAILY  . Ascorbic Acid (VITAMIN C) 1000 MG tablet Take 1,000 mg by mouth daily.  . Biotin 5000 MCG CAPS Take 1 capsule by mouth daily.  Marland Kitchen buPROPion (WELLBUTRIN) 75 MG tablet Take 1 tablet (75 mg total) by mouth 2 (two) times daily.  . celecoxib (CELEBREX) 200 MG capsule Take 200 mg by mouth as needed.  . CHELATED CALCIUM PO Take 1,200 mg by mouth daily.  . Chelated Zinc 50 MG TABS Take 1 tablet by mouth daily.  . Cholecalciferol (VITAMIN D) 2000 UNITS CAPS Take 2 capsules by mouth daily.  . Chromium Picolinate 500 MCG TABS Take 1 tablet by mouth daily.  . Coenzyme Q10 (CO Q-10) 100 MG CAPS Take 1 capsule by mouth daily.  Marland Kitchen estradiol (ESTRACE) 0.5 MG tablet Take 0.5 mg by mouth daily.  . Estradiol (VAGIFEM) 10 MCG TABS Insert 1 tablet by vaginal route once daily for 14 days then 1 tablet 2 times per week for duration of use.  . Evening Primrose Oil 500 MG  CAPS Take 1 capsule by mouth daily.  . folic acid (FOLVITE) 269 MCG tablet Take 400 mcg by mouth daily.  . Lactobacillus (PROBIOTIC ACIDOPHILUS PO) Take 32 mg by mouth daily.  . Melatonin 10 MG TABS Take 0.5 mg by mouth at bedtime as needed.  . niacin (NIASPAN) 1000 MG CR tablet Take 1,000 mg by mouth at bedtime.  . OMEGA 3 1200 MG CAPS Take 2 capsules by mouth daily.  . progesterone (PROMETRIUM) 100 MG capsule Take 1 capsule (100 mg total) by mouth daily.  . Selenium 200 MCG TABS Take 1 tablet by mouth daily.  . traZODone (DESYREL) 50 MG tablet Take 100 mg by mouth at bedtime.   . TURMERIC CURCUMIN PO  Take 800 mg by mouth daily.  . Glucosamine-Chondroit-Vit C-Mn (GLUCOSAMINE CHONDR 1500 COMPLX PO) Take 1,500 mg by mouth daily.  . NON FORMULARY Kyolic Garlic 5110 daily.  . Potassium 99 MG TABS Take 1 tablet by mouth daily.  . Red Yeast Rice Extract (RED YEAST RICE PO) Take 1,200 mg by mouth daily.  . valACYclovir (VALTREX) 1000 MG tablet Take 1,000 mg by mouth 3 (three) times daily.     No orders of the defined types were placed in this encounter.    Return in about 3 months (around 07/16/2014).

## 2014-04-15 NOTE — Assessment & Plan Note (Signed)
Hematoma is improving,  No mastitis

## 2014-04-15 NOTE — Progress Notes (Signed)
Pre-visit discussion using our clinic review tool. No additional management support is needed unless otherwise documented below in the visit note.  

## 2014-04-16 ENCOUNTER — Encounter: Payer: Self-pay | Admitting: Internal Medicine

## 2014-04-16 DIAGNOSIS — F5105 Insomnia due to other mental disorder: Secondary | ICD-10-CM | POA: Insufficient documentation

## 2014-04-16 DIAGNOSIS — F489 Nonpsychotic mental disorder, unspecified: Secondary | ICD-10-CM | POA: Insufficient documentation

## 2014-04-16 NOTE — Assessment & Plan Note (Signed)
Improving with suspension of Seroquel

## 2014-04-16 NOTE — Assessment & Plan Note (Signed)
Managed with estradiol, which she is considering stopping given her recent breast biopsy

## 2014-04-16 NOTE — Assessment & Plan Note (Signed)
Agree with increase in  Trazodone and continuing current dose.

## 2014-04-17 ENCOUNTER — Telehealth: Payer: Self-pay | Admitting: Internal Medicine

## 2014-04-17 MED ORDER — TRAZODONE HCL 100 MG PO TABS: 100.0000 mg | ORAL_TABLET | Freq: Every day | ORAL | Status: DC

## 2014-04-17 NOTE — Telephone Encounter (Signed)
Patient requesting 90 day [facility]ill [facility]. Trazodone 100 mg

## 2014-05-07 ENCOUNTER — Encounter: Payer: Self-pay | Admitting: Internal Medicine

## 2014-05-15 ENCOUNTER — Encounter: Payer: Self-pay | Admitting: Internal Medicine

## 2014-05-15 DIAGNOSIS — L989 Disorder of the skin and subcutaneous tissue, unspecified: Secondary | ICD-10-CM

## 2014-05-15 DIAGNOSIS — G4700 Insomnia, unspecified: Secondary | ICD-10-CM

## 2014-05-15 NOTE — Telephone Encounter (Signed)
Referral is in process as requested to [facility]

## 2014-05-15 NOTE — Telephone Encounter (Signed)
Referral is in process as requested to [place] [facility]

## 2014-05-17 ENCOUNTER — Other Ambulatory Visit: Payer: Self-pay | Admitting: Internal Medicine

## 2014-05-22 ENCOUNTER — Encounter: Payer: Self-pay | Admitting: Internal Medicine

## 2014-05-23 MED ORDER — LAMOTRIGINE 25 MG PO TABS: ORAL_TABLET | ORAL | Status: DC

## 2014-05-31 ENCOUNTER — Other Ambulatory Visit: Payer: Self-pay | Admitting: Internal Medicine

## 2014-07-22 ENCOUNTER — Encounter: Payer: Self-pay | Admitting: Internal Medicine

## 2014-08-10 ENCOUNTER — Other Ambulatory Visit: Payer: Self-pay | Admitting: Internal Medicine

## 2014-08-26 ENCOUNTER — Other Ambulatory Visit: Payer: Self-pay | Admitting: Internal Medicine

## 2014-08-26 DIAGNOSIS — R921 Mammographic calcification found on diagnostic imaging of breast: Secondary | ICD-10-CM

## 2014-11-06 ENCOUNTER — Ambulatory Visit
Admission: RE | Admit: 2014-11-06 | Discharge: 2014-11-06 | Disposition: A | Discharge status: Home / Self Care | Source: Ambulatory Visit | Attending: Internal Medicine | Admitting: Internal Medicine

## 2014-11-06 DIAGNOSIS — R921 Mammographic calcification found on diagnostic imaging of breast: Secondary | ICD-10-CM

## 2015-01-10 NOTE — Op Note (Signed)
PATIENT NAME:  Kelly Vaughn, MACMASTER MR#:  [id number] DATE OF BIRTH:  06-14-1956  DATE OF PROCEDURE:  01/03/2013  PREOPERATIVE DIAGNOSIS: Internal derangement of the left knee.   POSTOPERATIVE DIAGNOSES: 1. Tear of the posterior horn medial meniscus, left knee.  2. 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   PROCEDURES PERFORMED:  Left knee arthroscopy, partial medial meniscectomy and chondroplasty of the medial and patellofemoral compartments.   SURGEON: Laurice Record. Hooten, MD  ANESTHESIA: General.   ESTIMATED BLOOD LOSS: Minimal.   TOURNIQUET TIME: Not used.   FLUIDS REPLACED: 600 mL of crystalloid.   DRAINS: None.   INDICATIONS FOR SURGERY: The patient is a 59 year old female who has been seen for complaints of left knee pain. MRI demonstrated findings consistent with meniscal pathology as well as some irregularity of the articular cartilage. After discussion of the risks and benefits of surgical intervention, the patient expressed understanding of the risks and benefits and agreed with plans for surgical intervention.   PROCEDURE IN DETAIL: The patient was brought to the operating room and, after adequate general anesthesia was achieved, a tourniquet was placed on the patient's left thigh and the leg was placed in a leg holder. All bony prominences were well padded. The patient's left knee and leg were cleaned and prepped with alcohol and DuraPrep, draped in the usual sterile fashion. A "timeout" was performed as per usual protocol. The anticipated portal sites were injected with 0.25% Marcaine with epinephrine. An anterolateral portal was created and a cannula was inserted. A small effusion was evacuated. The scope was inserted and the knee was distended with fluid using the Stryker pump. The scope was advanced down the medial gutter into the medial compartment of the knee. Under visualization with the scope, an anteromedial portal was created and  hook probe was inserted. Inspection of the medial compartment demonstrated degenerative tear of the posterior horn of the medial meniscus. This was debrided using meniscal punches and a 4.5 mm shaver. Final contouring was performed using a 50 degrees ArthroCare wand. The remaining rim of meniscus was visualized and probed and felt to be stable. There were noted to be grade III changes of chondromalacia involving the medial femoral condyle and less of the medial tibial plateau. The areas were debrided using the 50 degree ArthroCare wand. Scope was then advanced into the intercondylar region. The anterior cruciate ligament was visualized and probed and felt to be stable. There was a chondral loose body that was retrieved from the notch using pituitary forceps. The scope was removed from the anterolateral portal and reinserted via the anteromedial portal so as to better visualize the lateral compartment. The lateral meniscus was visualized and probed and found to be stable. The articular cartilage in the lateral compartment was in excellent condition. Finally, the scope was positioned so as to visualize the patellofemoral articulation. Grade III and early grade IV changes of chondromalacia were noted. The surface was debrided using 50 degree ArthroCare wand. Reasonably good patellar tracking was appreciated.   The knee was irrigated with copious amounts of fluid and then suctioned dry. The anterolateral portal was reapproximated using 3-0 nylon. A combination of 0.25% Marcaine with epinephrine and 4 mg morphine was injected via the scope. The scope was removed and the anteromedial portal was reapproximated using 3-0 nylon. Sterile dressing was applied followed by application of an ice wrap.   The patient tolerated the procedure well. She was transported to the recovery room in stable condition.  ____________________________ Laurice Record. Holley Bouche., MD jph:cc D: 01/03/2013 19:25:47 ET T: 01/03/2013 21:01:02  ET JOB#: [id number]  cc: Laurice Record. Holley Bouche., MD, <Dictator> Laurice Record Holley Bouche MD ELECTRONICALLY SIGNED 01/05/2013 12:37

## 2015-02-27 ENCOUNTER — Other Ambulatory Visit: Payer: Self-pay | Admitting: Internal Medicine

## 2015-02-27 DIAGNOSIS — R921 Mammographic calcification found on diagnostic imaging of breast: Secondary | ICD-10-CM

## 2015-03-06 ENCOUNTER — Ambulatory Visit
Admission: RE | Admit: 2015-03-06 | Discharge: 2015-03-06 | Disposition: A | Discharge status: Home / Self Care | Source: Ambulatory Visit | Attending: Internal Medicine | Admitting: Internal Medicine

## 2015-03-06 DIAGNOSIS — R921 Mammographic calcification found on diagnostic imaging of breast: Secondary | ICD-10-CM

## 2015-04-10 ENCOUNTER — Encounter: Payer: Self-pay | Admitting: Psychiatry

## 2015-04-10 ENCOUNTER — Ambulatory Visit (INDEPENDENT_AMBULATORY_CARE_PROVIDER_SITE_OTHER): Admitting: Psychiatry

## 2015-04-10 VITALS — BP 118/82 | HR 66 | Temp 98.1°F | Ht 67.0 in | Wt 163.0 lb

## 2015-04-10 DIAGNOSIS — F3160 Bipolar disorder, current episode mixed, unspecified: Secondary | ICD-10-CM

## 2015-04-10 MED ORDER — ARIPIPRAZOLE 5 MG PO TABS: 5.0000 mg | ORAL_TABLET | Freq: Every day | ORAL | Status: DC

## 2015-04-10 MED ORDER — TRAZODONE HCL 100 MG PO TABS: 100.0000 mg | ORAL_TABLET | Freq: Every day | ORAL | Status: DC

## 2015-04-10 NOTE — Progress Notes (Signed)
Psychiatric Initial Adult Assessment   Patient Identification: Kelly Vaughn MRN:  [id number] Date of Evaluation:  04/10/2015 Referral Source: Lamonte Sakai, M.D Chief Complaint:   Chief Complaint    Establish Care; Anxiety     Visit Diagnosis: No diagnosis found. Diagnosis:   Patient Active Problem List   Diagnosis Date Noted  . 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 Breast calcifications on mammogram [R92.8] 03/26/2014  . Encounter for preventive health examination [Z00.00] 02/02/2014  . Memory deficits [R41.3] 02/02/2014  . Murmur, heart [R01.1] 05/11/2013  . Palpitations [R00.2] 05/11/2013  . Unspecified hypothyroidism [E03.9] 11/12/2012  . Menopausal syndrome [N95.1] 11/12/2012  . 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 History of whiplash injury [Z87.828] 11/12/2012  . allergic rhinitis [T78.40XA]   . Bipolar I disorder, most recent episode depressed [F31.30]   . Obesity, unspecified [E66.9]   . Headache disorder [R51]   . GERD (gastroesophageal reflux disease) [K21.9]   . Hyperlipidemia [E78.5]   . Urinary incontinence [R32]   . Hx of colonic polyps [Z86.010]   . Basal cell carcinoma [C44.91]    History of Present Illness:  Pt is a 59 year old married female who was referred by her primary care physician Dr. Humphrey Rolls Patient reported that she has just started following with her. She appeared very hyper active during the interview. She reported that she has been diagnosed bipolar disorder in 1998. She reported that she was following with a psychiatrist in [place] who has diagnosed her with rapid cycling  bipolar disorder. She has been tried on several psychotropic medications at that time including lithium lamotrigine Depakote and Seroquel. Patient reported that she responded well to Seroquel but she has been sleeping for 12-15 hours  on the medication. When she moved to [place] she decided that she wanted to taper herself off of the medication  and it took her 2 months to gradually took herself out of the medication. She started following with [facility] and was prescribed Prozac 30 mg. She was also given trazodone to help with sleep. Initially she was taking 200 mg but it was making her very tired and she started having difficulty with cough  as well. The dose was gradually decreased 150 mg which she is currently taking.  Patient reported that she continues to have racing thoughts and has been having difficult time controlling her mind. She she was noted to be jumping from one topic to the other during the interview. 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 She relocated to [place] to help her daughter but now she is married and has moved on to [place]. She is currently having relationship issues with her daughter as well. She reported that her husband does not say anything to her daughter but she has been very honest and will say anything which will come to her mind. Patient currently denied using any drugs or alcohol as she reported that there is a strong family history of alcoholism in her father and benzodiazepine use in her mother. Patient reported that she was given  a very short prescription of Ativan by Dr. Humphrey Rolls  but she does not want to use it on a regular basis. She appeared pleasantly cooperative during the interview.   Elements:  Location:  acute. Associated Signs/Symptoms: Depression Symptoms:  insomnia, difficulty concentrating,  anxiety, (Hypo) Manic Symptoms:  Flight of Ideas, Irritable Mood, I M ON ITCH - dont have filters, cannot accomadate.  Anxiety Symptoms:  Excessive Worry, Psychotic Symptoms:  none PTSD Symptoms: Negative NA  Past Medical History:  Past Medical History  Diagnosis Date  . allergic rhinitis   . Depression   . Eating disorder   . Headache disorder   . GERD  (gastroesophageal reflux disease)   . Hyperlipidemia   . hypothyroidism   . Urinary incontinence   . Hx of colonic polyps   . Basal cell carcinoma     Past Surgical History  Procedure Laterality Date  . Cesarean section  1985  and 1989  . Cholecystectomy  2010  . Breast biopsy  2012  . Tonsillectomy and adenoidectomy  1960  . Cervical ablation     Family History:  Family History  Problem Relation Age of Onset  . Cancer Sister     breast  . Cancer Sister     pancreatic  . Cancer Sister     cervical  . Mental illness Mother   . Alcohol abuse Mother   . Arthritis Mother   . Heart disease Father   . Hyperlipidemia Father   . Mental illness Maternal Aunt   . Arthritis Maternal Grandmother    Social History:   History   Social History  . Marital Status: Married    Spouse Name: N/A  . Number of Children: N/A  . Years of Education: N/A   Social History Main Topics  . Smoking status: Never Smoker   . Smokeless tobacco: Never Used  . Alcohol Use: Yes     Comment: occassional  . Drug Use: No  . Sexual Activity: Yes    Birth Control/ Protection: None   Other Topics Concern  . None   Social History Narrative   Additional Social History:   she has moved to [place] to be close to her daughter. 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  Musculoskeletal: Strength & Muscle Tone: within normal limits Gait & Station: normal Patient leans: N/A  Psychiatric Specialty Exam: HPI  Review of Systems  Psychiatric/Behavioral: Positive for depression. The patient is nervous/anxious and has insomnia.   All other systems reviewed and are negative.   Blood pressure 118/82, pulse 66, temperature 98.1 F (36.7 C), temperature source Tympanic, height 5\' 7"  (1.702 m), weight 163 lb (73.936 kg), SpO2 94 %.Body mass index is 25.52 kg/(m^2).  General Appearance: Casual  Eye Contact:  Fair  Speech:  Pressured  Volume:  Normal  Mood:  Anxious  Affect:   Full Range  Thought Process:  Circumstantial  Orientation:  Full (Time, Place, and Person)  Thought Content:  WDL  Suicidal Thoughts:  No  Homicidal Thoughts:  No  Memory:  Immediate;   Fair  Judgement:  Fair  Insight:  Fair  Psychomotor Activity:  Normal  Concentration:  Fair  Recall:  [employer] of Knowledge:Fair  Language: Fair  Akathisia:  No  Handed:  Ambidextrous  AIMS (if indicated):    Assets:  Communication Skills Desire for Improvement Physical Health Social Support  ADL's:  Intact  Cognition: WNL  Sleep:     Is the patient at risk to self?  No. Has the patient been a risk to self in the past 6 months?  No. Has the patient been a risk to self within the distant past?  No. Is the patient a risk to others?  No. Has the patient been a risk to others in the past 6 months?  No. Has the patient been a risk to others within the distant past?  No.  Allergies:  No Known Allergies Current Medications: Current Outpatient Prescriptions  Medication Sig Dispense Refill  . Alpha-Lipoic Acid 300 MG CAPS Take 1 capsule by mouth daily.    Francia Greaves THYROID 60 MG tablet TAKE 1 TABLET DAILY 90 tablet 0  . Ascorbic Acid (VITAMIN C) 1000 MG tablet Take 1,000 mg by mouth daily.    . Biotin 5000 MCG CAPS Take 1 capsule by mouth daily.    . Black Cohosh (BLACK COHOSH HOT FLASH RELIEF) 40 MG CAPS Take by mouth.    . celecoxib (CELEBREX) 200 MG capsule Take 200 mg by mouth as needed.    . CHELATED CALCIUM PO Take 1,200 mg by mouth daily.    . Chelated Zinc 50 MG TABS Take 1 tablet by mouth daily.    . Cholecalciferol (VITAMIN D) 2000 UNITS CAPS Take 2 capsules by mouth daily.    . Chromium Picolinate 500 MCG TABS Take 1 tablet by mouth daily.    . Coenzyme Q10 (CO Q-10) 100 MG CAPS Take 1 capsule by mouth daily.    . Estradiol (VAGIFEM) 10 MCG TABS Insert 1 tablet by vaginal route once daily for 14 days then 1 tablet 2 times per week for duration of use.    . Evening Primrose Oil 500 MG  CAPS Take 1 capsule by mouth daily.    . folic acid (FOLVITE) 546 MCG tablet Take 400 mcg by mouth daily.    . Glucosamine-Chondroit-Vit C-Mn (GLUCOSAMINE CHONDR 1500 COMPLX PO) Take 1,500 mg by mouth daily.    . Lactobacillus (PROBIOTIC ACIDOPHILUS PO) Take 32 mg by mouth daily.    Marland Kitchen LORazepam (ATIVAN) 0.5 MG tablet TAKE 1/2 TO 1 TABLET EVERY DAY AS NEEDED FOR ANXIETY  0  . Melatonin 10 MG TABS Take 0.5 mg by mouth at bedtime as needed.    . niacin (NIASPAN) 1000 MG CR tablet Take 1,000 mg by mouth at bedtime.    . NON FORMULARY Kyolic Garlic 2703 daily.    . OMEGA 3 1200 MG CAPS Take 2 capsules by mouth daily.    . Potassium 99 MG TABS Take 1 tablet by mouth daily.    . progesterone (PROMETRIUM) 100 MG capsule Take 1 capsule (100 mg total) by mouth daily. 90 capsule 3  . Red Yeast Rice Extract (RED YEAST RICE PO) Take 1,200 mg by mouth daily.    . Selenium 200 MCG TABS Take 1 tablet by mouth daily.    . traZODone (DESYREL) 100 MG tablet Take 1 tablet (100 mg total) by mouth at bedtime. 90 tablet 2  . traZODone (DESYREL) 50 MG tablet TAKE 1 TABLET BY MOUTH AT BEDTIME 90 tablet 1  . TURMERIC CURCUMIN PO Take 800 mg by mouth daily.    . valACYclovir (VALTREX) 1000 MG tablet Take 1,000 mg by mouth 3 (three) times daily.    . vitamin B-12 (CYANOCOBALAMIN) 1000 MCG tablet Take by mouth.    . zinc gluconate 50 MG tablet Take by mouth.     No current facility-administered medications for this visit.    Previous Psychotropic Medications:  She has tried several psycho topic medications in the past including lithium lamotrigine Depakote Seroquel Prozac Ativan  Substance Abuse History in the last 12 months:  No.  Consequences of Substance Abuse: Negative NA  Medical  Decision Making:  Review of Psycho-Social Stressors (1) and Review of Medication Regimen & Side Effects (2)  Treatment Plan Summary: Medication management  Discussed with patient about the medications and I will discontinue the  Prozac 10 mg at this time. I will start her on the Abilify 5 mg and she will take half a pill for one week and will then titrate the dose to 1 pill daily.  she will also continue on trazodone 100 mg daily at bedtime for insomnia. Discussed with patient about the medications and she demonstrated understanding.   More than 50% of the time spent in psychoeducation, counseling and coordination of care.    This note was generated in part or whole with voice recognition software. Voice regonition is usually quite accurate but there are transcription errors that can and very often do occur. I apologize for any typographical errors that were not detected and corrected.    Rainey Pines 7/21/20161:59 PM

## 2015-04-15 DIAGNOSIS — E7849 Other hyperlipidemia: Secondary | ICD-10-CM | POA: Insufficient documentation

## 2015-04-15 DIAGNOSIS — K219 Gastro-esophageal reflux disease without esophagitis: Secondary | ICD-10-CM | POA: Insufficient documentation

## 2015-04-15 DIAGNOSIS — Z86010 Personal history of colonic polyps: Secondary | ICD-10-CM | POA: Insufficient documentation

## 2015-04-15 DIAGNOSIS — R51 Headache: Secondary | ICD-10-CM

## 2015-04-15 DIAGNOSIS — E669 Obesity, unspecified: Secondary | ICD-10-CM | POA: Insufficient documentation

## 2015-04-15 DIAGNOSIS — T7840XA Allergy, unspecified, initial encounter: Secondary | ICD-10-CM | POA: Insufficient documentation

## 2015-04-15 DIAGNOSIS — R519 Headache, unspecified: Secondary | ICD-10-CM | POA: Insufficient documentation

## 2015-04-23 ENCOUNTER — Telehealth: Payer: Self-pay

## 2015-04-23 NOTE — Telephone Encounter (Signed)
pt called states that Dr. Gretel Acre on her visit on 04-10-15 changed her medications.  Pt states she is not sleeping and wants to know what she needs to do. pt states that Dr. Gretel Acre stopped her prozac and started her on abilify 5mg  and then she decrease her Trazodone from 200mg  down to 150mg .  Pt states she is not sleeping and wants to know what to do?

## 2015-04-29 ENCOUNTER — Ambulatory Visit (INDEPENDENT_AMBULATORY_CARE_PROVIDER_SITE_OTHER): Admitting: Psychiatry

## 2015-04-29 ENCOUNTER — Encounter: Payer: Self-pay | Admitting: Psychiatry

## 2015-04-29 VITALS — BP 120/68 | HR 84 | Temp 99.1°F | Ht 67.0 in | Wt 159.4 lb

## 2015-04-29 DIAGNOSIS — F319 Bipolar disorder, unspecified: Secondary | ICD-10-CM

## 2015-04-29 MED ORDER — RAMELTEON 8 MG PO TABS: 8.0000 mg | ORAL_TABLET | Freq: Every day | ORAL | Status: DC

## 2015-04-29 MED ORDER — ARIPIPRAZOLE 5 MG PO TABS: 5.0000 mg | ORAL_TABLET | Freq: Every day | ORAL | Status: DC

## 2015-04-29 MED ORDER — FLUOXETINE HCL 10 MG PO CAPS: 10.0000 mg | ORAL_CAPSULE | Freq: Every day | ORAL | Status: DC

## 2015-04-29 NOTE — Telephone Encounter (Signed)
Pt was seen today in the office

## 2015-04-29 NOTE — Progress Notes (Signed)
Psychiatric MD Follow Up Note   Patient Identification: Kelly Vaughn MRN:  [id number] Date of Evaluation:  04/29/2015 Referral Source: Lamonte Sakai, M.D Chief Complaint:   Chief Complaint    Follow-up; Anxiety; Other     Visit Diagnosis:    ICD-9-CM ICD-10-CM   1. Bipolar I disorder, most recent episode (or current) unspecified 296.7 F31.9    Diagnosis:   Patient Active Problem List   Diagnosis Date Noted  . Allergic state [T78.40XA] 04/15/2015  . Acid reflux [K21.9] 04/15/2015  . 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 Cephalalgia [R51] 04/15/2015  . History of colon polyps [Z86.010] 04/15/2015  . Adiposity [E66.9] 04/15/2015  . 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 Insomnia related to another mental disorder [F48.9] 04/16/2014  . Breast calcifications on mammogram [R92.8] 03/26/2014  . Abnormal finding on breast imaging [R92.8] 03/26/2014  . Cognitive complaints [R41.9] 02/13/2014  . Encounter for preventive health examination [Z00.00] 02/02/2014  . Memory deficits [R41.3] 02/02/2014  . Amnesia [R41.3] 02/02/2014  . Encounter for general adult medical examination without abnormal findings [Z00.00] 02/02/2014  . Murmur, heart [R01.1] 05/11/2013  . Palpitations [R00.2] 05/11/2013  . Cardiac murmur [R01.1] 05/11/2013  . Unspecified hypothyroidism [E03.9] 11/12/2012  . Menopausal syndrome [N95.1] 11/12/2012  . 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 History of whiplash injury [Z87.828] 11/12/2012  . Adult hypothyroidism [E03.9] 11/12/2012  . Arthritis, degenerative [M19.90] 11/12/2012  . H/O injury, presenting hazards to health [facility] 11/12/2012  . Menopausal symptom [N95.1] 11/12/2012  . allergic rhinitis [T78.40XA]   . Bipolar I disorder, most recent episode depressed [F31.30]   . Obesity, unspecified [E66.9]   . Headache disorder [R51]   . GERD (gastroesophageal reflux disease) [K21.9]   . Hyperlipidemia  [E78.5]   . Urinary incontinence [R32]   . Hx of colonic polyps [Z86.010]   . Basal cell carcinoma [C44.91]    History of Present Illness:  Pt is a 59 year old married female who presented for the follow-up appointment. She reported that she has stopped the Prozac by gradually tapering herself out of it. She has started taking the Abilify and was unable to sleep. Patient reported that her thoughts are becoming more organized and she is able to think clearly. She continues to appear somewhat hyper during the interview. She reported that she is unable to sleep on the trazodone and she tried over-the-counter Tylenol PM but she wakes up at 1:00 in the morning. Her husband was also concerned about her inability to sleep. Patient reported that she wants to try some other medications as she has cognitive deficits related to the Seroquel. Patient currently denied having any suicidal ideations or plans. She reported that she called the office last week and was told that I would not be available until this week. Patient reported that she wants her medications to be adjusted at this time. She currently denied having any suicidal ideations or plans.  Past Medical History:  Past Medical History  Diagnosis Date  . allergic rhinitis   . Depression   . Eating disorder   . Headache disorder   . GERD (gastroesophageal reflux disease)   . Hyperlipidemia   . hypothyroidism   . Urinary incontinence   . Hx of colonic polyps   . Basal cell carcinoma     Past Surgical History  Procedure Laterality Date  . Cesarean section  1985  and 1989  . Cholecystectomy  2010  . Breast biopsy  2012  . Tonsillectomy  and adenoidectomy  1960  . Cervical ablation     Family History:  Family History  Problem Relation Age of Onset  . Cancer Sister     breast  . Cancer Sister     pancreatic  . Cancer Sister     cervical  . Mental illness Mother   . Alcohol abuse Mother   . Arthritis Mother   . Heart disease Father   .  Hyperlipidemia Father   . Mental illness Maternal Aunt   . Arthritis Maternal Grandmother    Social History:   History   Social History  . Marital Status: Married    Spouse Name: N/A  . Number of Children: N/A  . Years of Education: N/A   Social History Main Topics  . Smoking status: Never Smoker   . Smokeless tobacco: Never Used  . Alcohol Use: Yes     Comment: occassional  . Drug Use: No  . Sexual Activity: Yes    Birth Control/ Protection: None   Other Topics Concern  . None   Social History Narrative   Additional Social History:   she has moved to [place] to be close to her daughter. 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  Musculoskeletal: Strength & Muscle Tone: within normal limits Gait & Station: normal Patient leans: N/A  Psychiatric Specialty Exam: Anxiety Symptoms include insomnia and nervous/anxious behavior.      Review of Systems  Psychiatric/Behavioral: Positive for depression. The patient is nervous/anxious and has insomnia.   All other systems reviewed and are negative.   Blood pressure 120/68, pulse 84, temperature 99.1 F (37.3 C), temperature source Tympanic, height 5\' 7"  (1.702 m), weight 159 lb 6.4 oz (72.303 kg), SpO2 97 %.Body mass index is 24.96 kg/(m^2).  General Appearance: Casual  Eye Contact:  Fair  Speech:  Pressured  Volume:  Normal  Mood:  Anxious  Affect:  Full Range  Thought Process:  Circumstantial  Orientation:  Full (Time, Place, and Person)  Thought Content:  WDL  Suicidal Thoughts:  No  Homicidal Thoughts:  No  Memory:  Immediate;   Fair  Judgement:  Fair  Insight:  Fair  Psychomotor Activity:  Normal  Concentration:  Fair  Recall:  [employer] of Knowledge:Fair  Language: Fair  Akathisia:  No  Handed:  Ambidextrous  AIMS (if indicated):    Assets:  Communication Skills Desire for Improvement Physical Health Social Support  ADL's:  Intact  Cognition: WNL  Sleep:     Is the  patient at risk to self?  No. Has the patient been a risk to self in the past 6 months?  No. Has the patient been a risk to self within the distant past?  No. Is the patient a risk to others?  No. Has the patient been a risk to others in the past 6 months?  No. Has the patient been a risk to others within the distant past?  No.  Allergies:  No Known Allergies Current Medications: Current Outpatient Prescriptions  Medication Sig Dispense Refill  . Alpha-Lipoic Acid 300 MG CAPS Take 1 capsule by mouth daily.    . ARIPiprazole (ABILIFY) 5 MG tablet Take 1 tablet (5 mg total) by mouth daily. 30 tablet 0  . ARMOUR THYROID 60 MG tablet TAKE 1 TABLET DAILY 90 tablet 0  . Ascorbic Acid (VITAMIN C) 1000 MG tablet Take 1,000 mg by mouth daily.    . Biotin 5000 MCG CAPS Take  1 capsule by mouth daily.    . Black Cohosh (BLACK COHOSH HOT FLASH RELIEF) 40 MG CAPS Take by mouth.    . celecoxib (CELEBREX) 200 MG capsule Take 200 mg by mouth as needed.    . CHELATED CALCIUM PO Take 1,200 mg by mouth daily.    . Chelated Zinc 50 MG TABS Take 1 tablet by mouth daily.    . Cholecalciferol (VITAMIN D) 2000 UNITS CAPS Take 2 capsules by mouth daily.    . Chromium Picolinate 500 MCG TABS Take 1 tablet by mouth daily.    . Coenzyme Q10 (CO Q-10) 100 MG CAPS Take 1 capsule by mouth daily.    . Estradiol (VAGIFEM) 10 MCG TABS Insert 1 tablet by vaginal route once daily for 14 days then 1 tablet 2 times per week for duration of use.    . Evening Primrose Oil 500 MG CAPS Take 1 capsule by mouth daily.    . folic acid (FOLVITE) 893 MCG tablet Take 400 mcg by mouth daily.    . Lactobacillus (PROBIOTIC ACIDOPHILUS PO) Take 32 mg by mouth daily.    . Melatonin 10 MG TABS Take 0.5 mg by mouth at bedtime as needed.    . niacin (NIASPAN) 1000 MG CR tablet Take 1,000 mg by mouth at bedtime.    . NON FORMULARY Kyolic Garlic 8101 daily.    . OMEGA 3 1200 MG CAPS Take 2 capsules by mouth daily.    . Potassium 99 MG TABS  Take 1 tablet by mouth daily.    . progesterone (PROMETRIUM) 100 MG capsule Take 1 capsule (100 mg total) by mouth daily. 90 capsule 3  . Red Yeast Rice Extract (RED YEAST RICE PO) Take 1,200 mg by mouth daily.    . Selenium 200 MCG TABS Take 1 tablet by mouth daily.    . simvastatin (ZOCOR) 40 MG tablet TAKE 1/2 TABLET DAILY AS DIRECTED  2  . traZODone (DESYREL) 100 MG tablet Take 1 tablet (100 mg total) by mouth at bedtime. 90 tablet 2  . TURMERIC CURCUMIN PO Take 800 mg by mouth daily.    . vitamin B-12 (CYANOCOBALAMIN) 1000 MCG tablet Take by mouth.    . zinc gluconate 50 MG tablet Take by mouth.    . Glucosamine-Chondroit-Vit C-Mn (GLUCOSAMINE CHONDR 1500 COMPLX PO) Take 1,500 mg by mouth daily.     No current facility-administered medications for this visit.    Previous Psychotropic Medications:  She has tried several psycho topic medications in the past including lithium lamotrigine Depakote Seroquel Prozac Ativan  Substance Abuse History in the last 12 months:  No.  Consequences of Substance Abuse: Negative NA  Medical Decision Making:  Review of Psycho-Social Stressors (1) and Review of Medication Regimen & Side Effects (2)  Treatment Plan Summary: Medication management  Discussed with patient about the medications and she wants to restart the Prozac 10 mg in the morning. I will also start her on Rozerem 8 mg daily at bedtime for insomnia. She will continue on Abilify 5 mg by mouth daily. Discussed with patient about the medications and she demonstrated understanding. She will follow-up in 2 weeks or earlier depending on her symptoms.    More than 50% of the time spent in psychoeducation, counseling and coordination of care.    This note was generated in part or whole with voice recognition software. Voice regonition is usually quite accurate but there are transcription errors that can and very often do occur. I  apologize for any typographical errors that were not detected  and corrected.    Rainey Pines 8/9/201611:30 AM

## 2015-05-07 ENCOUNTER — Telehealth: Payer: Self-pay

## 2015-05-07 NOTE — Telephone Encounter (Signed)
rozerem 8mg  was approved approved by [facility] until 09-19-2098.

## 2015-05-12 ENCOUNTER — Encounter: Payer: Self-pay | Admitting: Psychiatry

## 2015-05-12 ENCOUNTER — Ambulatory Visit (INDEPENDENT_AMBULATORY_CARE_PROVIDER_SITE_OTHER): Admitting: Psychiatry

## 2015-05-12 VITALS — BP 118/72 | HR 62 | Temp 97.4°F | Ht 67.0 in | Wt 163.0 lb

## 2015-05-12 DIAGNOSIS — F3160 Bipolar disorder, current episode mixed, unspecified: Secondary | ICD-10-CM | POA: Diagnosis not present

## 2015-05-12 MED ORDER — ARIPIPRAZOLE 5 MG PO TABS: 5.0000 mg | ORAL_TABLET | Freq: Every day | ORAL | Status: DC

## 2015-05-12 MED ORDER — FLUOXETINE HCL 10 MG PO CAPS: 10.0000 mg | ORAL_CAPSULE | Freq: Every day | ORAL | Status: DC

## 2015-05-12 MED ORDER — TRAZODONE HCL 100 MG PO TABS: 200.0000 mg | ORAL_TABLET | Freq: Every day | ORAL | Status: DC

## 2015-05-12 NOTE — Progress Notes (Signed)
Psychiatric MD Follow Up Note   Patient Identification: Kelly Vaughn MRN:  [id number] Date of Evaluation:  05/12/2015 Referral Source: Lamonte Sakai, M.D Chief Complaint:   Chief Complaint    Medication Refill; Medication Problem; Follow-up; Anxiety; Insomnia     Visit Diagnosis:    ICD-9-CM ICD-10-CM   1. Bipolar I disorder, most recent episode mixed 296.60 F31.60    Diagnosis:   Patient Active Problem List   Diagnosis Date Noted  . Allergic state [T78.40XA] 04/15/2015  . Acid reflux [K21.9] 04/15/2015  . 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 Cephalalgia [R51] 04/15/2015  . History of colon polyps [Z86.010] 04/15/2015  . Adiposity [E66.9] 04/15/2015  . 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 Insomnia related to another mental disorder [F48.9] 04/16/2014  . Breast calcifications on mammogram [R92.8] 03/26/2014  . Abnormal finding on breast imaging [R92.8] 03/26/2014  . Cognitive complaints [R41.9] 02/13/2014  . Encounter for preventive health examination [Z00.00] 02/02/2014  . Memory deficits [R41.3] 02/02/2014  . Amnesia [R41.3] 02/02/2014  . Encounter for general adult medical examination without abnormal findings [Z00.00] 02/02/2014  . Murmur, heart [R01.1] 05/11/2013  . Palpitations [R00.2] 05/11/2013  . Cardiac murmur [R01.1] 05/11/2013  . Unspecified hypothyroidism [E03.9] 11/12/2012  . Menopausal syndrome [N95.1] 11/12/2012  . 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 History of whiplash injury [Z87.828] 11/12/2012  . Adult hypothyroidism [E03.9] 11/12/2012  . Arthritis, degenerative [M19.90] 11/12/2012  . H/O injury, presenting hazards to health [facility] 11/12/2012  . Menopausal symptom [N95.1] 11/12/2012  . allergic rhinitis [T78.40XA]   . Bipolar I disorder, most recent episode depressed [F31.30]   . Obesity, unspecified [E66.9]   . Headache disorder [R51]   . GERD (gastroesophageal reflux disease)  [K21.9]   . Hyperlipidemia [E78.5]   . Urinary incontinence [R32]   . Hx of colonic polyps [Z86.010]   . Basal cell carcinoma [C44.91]    History of Present Illness:  Pt is a 59 year old married female who presented for the follow-up appointment. She reported that she continues to have problems with insomnia. Patient reported that she is unable to sleep after she started taking the Rozerem and has been having difficulty staying asleep. She has been taking the medication on a regular basis after she reported approved through [employer]. Patient reported that she tried it for 5 days and also try taking the trazodone. She appeared much calmer alert and oriented during the interview. She reported that she has also noticed improvement in her symptoms with the combination of Prozac and Abilify. She wants to try taking the trazodone in the middle of the night as she wakes up after 3 hours. She is noticed that she has gained some weight after taking the medications. She currently denied having any suicidal ideations or plans.  Past Medical History:  Past Medical History  Diagnosis Date  . allergic rhinitis   . Depression   . Eating disorder   . Headache disorder   . GERD (gastroesophageal reflux disease)   . Hyperlipidemia   . hypothyroidism   . Urinary incontinence   . Hx of colonic polyps   . Basal cell carcinoma     Past Surgical History  Procedure Laterality Date  . Cesarean section  1985  and 1989  . Cholecystectomy  2010  . Breast biopsy  2012  . Tonsillectomy and adenoidectomy  1960  . Cervical ablation     Family History:  Family History  Problem Relation Age of  Onset  . Cancer Sister     breast  . Cancer Sister     pancreatic  . Cancer Sister     cervical  . Mental illness Mother   . Alcohol abuse Mother   . Arthritis Mother   . Heart disease Father   . Hyperlipidemia Father   . Mental illness Maternal Aunt   . Arthritis Maternal Grandmother    Social  History:   Social History   Social History  . Marital Status: Married    Spouse Name: N/A  . Number of Children: N/A  . Years of Education: N/A   Social History Main Topics  . Smoking status: Never Smoker   . Smokeless tobacco: Never Used  . Alcohol Use: Yes     Comment: occassional  . Drug Use: No  . Sexual Activity: Yes    Birth Control/ Protection: None   Other Topics Concern  . None   Social History Narrative   Additional Social History:   she has moved to [place] to be close to her daughter. 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  Musculoskeletal: Strength & Muscle Tone: within normal limits Gait & Station: normal Patient leans: N/A  Psychiatric Specialty Exam: Anxiety Symptoms include insomnia and nervous/anxious behavior.    Insomnia PMH includes: depression.    Review of Systems  Psychiatric/Behavioral: Positive for depression. The patient is nervous/anxious and has insomnia.   All other systems reviewed and are negative.   Blood pressure 118/72, pulse 62, temperature 97.4 F (36.3 C), temperature source Tympanic, height 5\' 7"  (1.702 m), weight 163 lb (73.936 kg), SpO2 96 %.Body mass index is 25.52 kg/(m^2).  General Appearance: Casual  Eye Contact:  Fair  Speech:  Pressured  Volume:  Normal  Mood:  Anxious  Affect:  Full Range  Thought Process:  Coherent and Goal Directed  Orientation:  Full (Time, Place, and Person)  Thought Content:  WDL  Suicidal Thoughts:  No  Homicidal Thoughts:  No  Memory:  Immediate;   Fair  Judgement:  Fair  Insight:  Fair  Psychomotor Activity:  Normal  Concentration:  Fair  Recall:  [employer] of Knowledge:Fair  Language: Fair  Akathisia:  No  Handed:  Ambidextrous  AIMS (if indicated):    Assets:  Communication Skills Desire for Improvement Physical Health Social Support  ADL's:  Intact  Cognition: WNL  Sleep:     Is the patient at risk to self?  No. Has the patient been a  risk to self in the past 6 months?  No. Has the patient been a risk to self within the distant past?  No. Is the patient a risk to others?  No. Has the patient been a risk to others in the past 6 months?  No. Has the patient been a risk to others within the distant past?  No.  Allergies:  No Known Allergies Current Medications: Current Outpatient Prescriptions  Medication Sig Dispense Refill  . Alpha-Lipoic Acid 300 MG CAPS Take 1 capsule by mouth daily.    . ARIPiprazole (ABILIFY) 5 MG tablet Take 1 tablet (5 mg total) by mouth daily. 30 tablet 1  . ARMOUR THYROID 60 MG tablet TAKE 1 TABLET DAILY 90 tablet 0  . Ascorbic Acid (VITAMIN C) 1000 MG tablet Take 1,000 mg by mouth daily.    . Biotin 5000 MCG CAPS Take 1 capsule by mouth daily.    . Black Cohosh (BLACK [place]  RELIEF) 40 MG CAPS Take by mouth.    . celecoxib (CELEBREX) 200 MG capsule Take 200 mg by mouth as needed.    . CHELATED CALCIUM PO Take 1,200 mg by mouth daily.    . Chelated Zinc 50 MG TABS Take 1 tablet by mouth daily.    . Cholecalciferol (VITAMIN D) 2000 UNITS CAPS Take 2 capsules by mouth daily.    . Coenzyme Q10 (CO Q-10) 100 MG CAPS Take 1 capsule by mouth daily.    . Estradiol (VAGIFEM) 10 MCG TABS Insert 1 tablet by vaginal route once daily for 14 days then 1 tablet 2 times per week for duration of use.    . Evening Primrose Oil 500 MG CAPS Take 1 capsule by mouth daily.    Marland Kitchen FLUoxetine (PROZAC) 10 MG capsule Take 1 capsule (10 mg total) by mouth daily. 30 capsule 1  . folic acid (FOLVITE) 297 MCG tablet Take 400 mcg by mouth daily.    . Glucosamine-Chondroit-Vit C-Mn (GLUCOSAMINE CHONDR 1500 COMPLX PO) Take 1,500 mg by mouth daily.    . Lactobacillus (PROBIOTIC ACIDOPHILUS PO) Take 32 mg by mouth daily.    . niacin (NIASPAN) 1000 MG CR tablet Take 1,000 mg by mouth at bedtime.    . NON FORMULARY Kyolic Garlic 9892 daily.    . OMEGA 3 1200 MG CAPS Take 2 capsules by mouth daily.    . Potassium 99 MG  TABS Take 1 tablet by mouth daily.    . ramelteon (ROZEREM) 8 MG tablet Take 1 tablet (8 mg total) by mouth at bedtime. 30 tablet 1  . Selenium 200 MCG TABS Take 1 tablet by mouth daily.    . simvastatin (ZOCOR) 40 MG tablet TAKE 1/2 TABLET DAILY AS DIRECTED  2  . TURMERIC CURCUMIN PO Take 800 mg by mouth daily.    . vitamin B-12 (CYANOCOBALAMIN) 1000 MCG tablet Take by mouth.    . zinc gluconate 50 MG tablet Take by mouth.    . progesterone (PROMETRIUM) 100 MG capsule Take 1 capsule (100 mg total) by mouth daily. (Patient not taking: Reported on 05/12/2015) 90 capsule 3  . Red Yeast Rice Extract (RED YEAST RICE PO) Take 1,200 mg by mouth daily.     No current facility-administered medications for this visit.    Previous Psychotropic Medications:  She has tried several psycho topic medications in the past including lithium lamotrigine Depakote Seroquel Prozac Ativan  Substance Abuse History in the last 12 months:  No.  Consequences of Substance Abuse: Negative NA  Medical Decision Making:  Review of Psycho-Social Stressors (1) and Review of Medication Regimen & Side Effects (2)  Treatment Plan Summary: Medication management  Discussed with patient about the medications and she wants to restart the Prozac 10 mg in the morning.  Patient reported that she will continue the Abilify as prescribed. She does want to try taking any other medications including Seroquel and Ambien at this time. She will try the trazodone 100-200 mg in the middle of the night when she will wake up after sleeping for 2-3 hours. She agreed with the plan. Most of her medications prescribed through [facility] at this time  Follow-up in 1 month    More than 50% of the time spent in psychoeducation, counseling and coordination of care.    This note was generated in part or whole with voice recognition software. Voice regonition is usually quite accurate but there are transcription errors that can and very often  do occur. I apologize for any typographical errors that were not detected and corrected.    Rainey Pines 8/22/20161:03 PM

## 2015-05-13 ENCOUNTER — Ambulatory Visit: Payer: Self-pay | Admitting: Psychiatry

## 2015-05-19 NOTE — Progress Notes (Signed)
This has been reorder, not discontinued.  

## 2015-06-03 ENCOUNTER — Encounter: Payer: Self-pay | Admitting: Psychiatry

## 2015-06-03 ENCOUNTER — Ambulatory Visit (INDEPENDENT_AMBULATORY_CARE_PROVIDER_SITE_OTHER): Admitting: Psychiatry

## 2015-06-03 VITALS — BP 110/64 | HR 84 | Temp 98.7°F | Ht 67.0 in | Wt 161.8 lb

## 2015-06-03 DIAGNOSIS — F3160 Bipolar disorder, current episode mixed, unspecified: Secondary | ICD-10-CM | POA: Diagnosis not present

## 2015-06-03 MED ORDER — FLUOXETINE HCL 10 MG PO CAPS: 10.0000 mg | ORAL_CAPSULE | Freq: Every day | ORAL | Status: DC

## 2015-06-03 MED ORDER — ARIPIPRAZOLE 2 MG PO TABS: 2.0000 mg | ORAL_TABLET | Freq: Every day | ORAL | Status: DC

## 2015-06-03 MED ORDER — TRAZODONE HCL 100 MG PO TABS: 200.0000 mg | ORAL_TABLET | Freq: Every day | ORAL | Status: DC

## 2015-06-03 NOTE — Progress Notes (Signed)
Psychiatric MD Follow Up Note   Patient Identification: Kelly Vaughn MRN:  [id number] Date of Evaluation:  06/03/2015 Referral Source: Lamonte Sakai, M.D Chief Complaint:   Chief Complaint    Follow-up; Medication Refill; Medication Problem; Anxiety; Other; Insomnia     Visit Diagnosis:    ICD-9-CM ICD-10-CM   1. Bipolar I disorder, most recent episode mixed 296.60 F31.60    Diagnosis:   Patient Active Problem List   Diagnosis Date Noted  . Allergic state [T78.40XA] 04/15/2015  . Acid reflux [K21.9] 04/15/2015  . 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 Cephalalgia [R51] 04/15/2015  . History of colon polyps [Z86.010] 04/15/2015  . Adiposity [E66.9] 04/15/2015  . 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 Insomnia related to another mental disorder [F48.9] 04/16/2014  . Breast calcifications on mammogram [R92.8] 03/26/2014  . Abnormal finding on breast imaging [R92.8] 03/26/2014  . Cognitive complaints [R41.9] 02/13/2014  . Encounter for preventive health examination [Z00.00] 02/02/2014  . Memory deficits [R41.3] 02/02/2014  . Amnesia [R41.3] 02/02/2014  . Encounter for general adult medical examination without abnormal findings [Z00.00] 02/02/2014  . Murmur, heart [R01.1] 05/11/2013  . Palpitations [R00.2] 05/11/2013  . Cardiac murmur [R01.1] 05/11/2013  . Unspecified hypothyroidism [E03.9] 11/12/2012  . Menopausal syndrome [N95.1] 11/12/2012  . 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 History of whiplash injury [Z87.828] 11/12/2012  . Adult hypothyroidism [E03.9] 11/12/2012  . Arthritis, degenerative [M19.90] 11/12/2012  . H/O injury, presenting hazards to health [facility] 11/12/2012  . Menopausal symptom [N95.1] 11/12/2012  . allergic rhinitis [T78.40XA]   . Bipolar I disorder, most recent episode depressed [F31.30]   . Obesity, unspecified [E66.9]   . Headache disorder [R51]   . GERD (gastroesophageal reflux  disease) [K21.9]   . Hyperlipidemia [E78.5]   . Urinary incontinence [R32]   . Hx of colonic polyps [Z86.010]   . Basal cell carcinoma [C44.91]    History of Present Illness:  Pt is a 59 year old married female who presented for the follow-up appointment. She reported that she has been having some issues related to the Abilify and she has noticed that the medications making her very tired and she was feeling restless during the daytime and was unable to concentrate and read the book by sitting continuously. Patient reported that she takes 100 mg of trazodone at the time of going to bed and will take and other 100 mg and she will wake up around 1:59 AM. She stated that it really helps her and she will have a sustained sleep throughout the night. She reported that she is experiencing some coughing during the night and is taking cough drops for the same. Patient reported that Prozac is helping her with her depressive symptoms and she is noticing more stability in her more symptoms. She is willing to decrease the dose of Abilify to 2 mg at this time. Patient currently denied having any suicidal ideations or plans and was discussing in detail about the adverse effects related to Seroquel which she has taken for almost 15 years in the past and was able to take herself out of the medication in last summer    Past Medical History:  Past Medical History  Diagnosis Date  . allergic rhinitis   . Depression   . Eating disorder   . Headache disorder   . GERD (gastroesophageal reflux disease)   . Hyperlipidemia   . hypothyroidism   . Urinary incontinence   . Hx of colonic polyps   .  Basal cell carcinoma     Past Surgical History  Procedure Laterality Date  . Cesarean section  1985  and 1989  . Cholecystectomy  2010  . Breast biopsy  2012  . Tonsillectomy and adenoidectomy  1960  . Cervical ablation     Family History:  Family History  Problem Relation Age of Onset  . Cancer Sister     breast   . Cancer Sister     pancreatic  . Cancer Sister     cervical  . Mental illness Mother   . Alcohol abuse Mother   . Arthritis Mother   . Heart disease Father   . Hyperlipidemia Father   . Mental illness Maternal Aunt   . Arthritis Maternal Grandmother    Social History:   Social History   Social History  . Marital Status: Married    Spouse Name: N/A  . Number of Children: N/A  . Years of Education: N/A   Social History Main Topics  . Smoking status: Never Smoker   . Smokeless tobacco: Never Used  . Alcohol Use: Yes     Comment: occassional  . Drug Use: No  . Sexual Activity: Yes    Birth Control/ Protection: None   Other Topics Concern  . None   Social History Narrative   Additional Social History:   she has moved to [place] to be close to her daughter. 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  Musculoskeletal: Strength & Muscle Tone: within normal limits Gait & Station: normal Patient leans: N/A  Psychiatric Specialty Exam: Anxiety Symptoms include insomnia and nervous/anxious behavior.    Insomnia PMH includes: depression.    Review of Systems  Psychiatric/Behavioral: Positive for depression. The patient is nervous/anxious and has insomnia.   All other systems reviewed and are negative.   Blood pressure 110/64, pulse 84, temperature 98.7 F (37.1 C), temperature source Tympanic, height 5\' 7"  (1.702 m), weight 161 lb 12.8 oz (73.392 kg), SpO2 92 %.Body mass index is 25.34 kg/(m^2).  General Appearance: Casual  Eye Contact:  Fair  Speech:  Clear and Coherent  Volume:  Normal  Mood:  Anxious  Affect:  Full Range  Thought Process:  Coherent and Goal Directed  Orientation:  Full (Time, Place, and Person)  Thought Content:  WDL  Suicidal Thoughts:  No  Homicidal Thoughts:  No  Memory:  Immediate;   Fair  Judgement:  Fair  Insight:  Fair  Psychomotor Activity:  Normal  Concentration:  Fair  Recall:  [employer] of  Knowledge:Fair  Language: Fair  Akathisia:  No  Handed:  Ambidextrous  AIMS (if indicated):    Assets:  Communication Skills Desire for Improvement Physical Health Social Support  ADL's:  Intact  Cognition: WNL  Sleep:      Allergies:  No Known Allergies Current Medications: Current Outpatient Prescriptions  Medication Sig Dispense Refill  . Alpha-Lipoic Acid 300 MG CAPS Take 1 capsule by mouth daily.    . ARIPiprazole (ABILIFY) 5 MG tablet Take 1 tablet (5 mg total) by mouth daily. 90 tablet 3  . ARMOUR THYROID 60 MG tablet TAKE 1 TABLET DAILY 90 tablet 0  . Ascorbic Acid (VITAMIN C) 1000 MG tablet Take 1,000 mg by mouth daily.    . Biotin 5000 MCG CAPS Take 1 capsule by mouth daily.    . Black Cohosh (BLACK COHOSH HOT FLASH RELIEF) 40 MG CAPS Take by mouth.    . celecoxib (CELEBREX)  200 MG capsule Take 200 mg by mouth as needed.    . CHELATED CALCIUM PO Take 1,200 mg by mouth daily.    . Chelated Zinc 50 MG TABS Take 1 tablet by mouth daily.    . Cholecalciferol (VITAMIN D) 2000 UNITS CAPS Take 2 capsules by mouth daily.    . Coenzyme Q10 (CO Q-10) 100 MG CAPS Take 1 capsule by mouth daily.    . Estradiol (VAGIFEM) 10 MCG TABS Insert 1 tablet by vaginal route once daily for 14 days then 1 tablet 2 times per week for duration of use.    Marland Kitchen FLUoxetine (PROZAC) 10 MG capsule Take 1 capsule (10 mg total) by mouth daily. 90 capsule 3  . Lactobacillus (PROBIOTIC ACIDOPHILUS PO) Take 32 mg by mouth daily.    Marland Kitchen LORazepam (ATIVAN) 0.5 MG tablet Take 0.5 mg by mouth every 8 (eight) hours. Take 1/5 to 1 tablet every day as needed for anxiety.    . NON FORMULARY Kyolic Garlic 6160 daily.    . OMEGA 3 1200 MG CAPS Take 2 capsules by mouth daily.    . Potassium 99 MG TABS Take 1 tablet by mouth daily.    . progesterone (PROMETRIUM) 100 MG capsule Take 1 capsule (100 mg total) by mouth daily. 90 capsule 3  . Red Yeast Rice Extract (RED YEAST RICE PO) Take 1,200 mg by mouth daily.    .  simvastatin (ZOCOR) 40 MG tablet TAKE 1/2 TABLET DAILY AS DIRECTED  2  . traZODone (DESYREL) 100 MG tablet Take 2 tablets (200 mg total) by mouth at bedtime. 180 tablet 3  . TURMERIC CURCUMIN PO Take 800 mg by mouth daily.    Marland Kitchen zinc gluconate 50 MG tablet Take by mouth.    . ARIPiprazole (ABILIFY) 5 MG tablet Take 1 tablet (5 mg total) by mouth daily. (Patient not taking: Reported on 06/03/2015) 30 tablet 0  . ramelteon (ROZEREM) 8 MG tablet Take 1 tablet (8 mg total) by mouth at bedtime. (Patient not taking: Reported on 06/03/2015) 30 tablet 1   No current facility-administered medications for this visit.    Previous Psychotropic Medications:  She has tried several psycho topic medications in the past including lithium lamotrigine Depakote Seroquel Prozac Ativan   Medical Decision Making:  Review of Psycho-Social Stressors (1) and Review of Medication Regimen & Side Effects (2)  Treatment Plan Summary: Medication management   Depression Patient is currently on Prozac 10 mg and she is feeling stable on the medication  Insomnia Patient takes trazodone 100 mg at bedtime and will take and her 100 mg in the middle of the night to have sustained sleep. She is reporting that her sleeping is improving  Mood symptoms Patient is currently on Abilify and is interested in decreasing the dose 2 mg and we agreed with the plan  All her medications were refilled for the next [facility] days [facility]  She will follow-up in 2 months   Discussed with patient about the medications      More than 50% of the time spent in psychoeducation, counseling and coordination of care.  Time spent 25 mins    This note was generated in part or whole with voice recognition software. Voice regonition is usually quite accurate but there are transcription errors that can and very often do occur. I apologize for any typographical errors that were not detected and corrected.    Rainey Pines 9/13/20161:35  PM

## 2015-07-31 ENCOUNTER — Encounter: Payer: Self-pay | Admitting: Psychiatry

## 2015-07-31 ENCOUNTER — Ambulatory Visit (INDEPENDENT_AMBULATORY_CARE_PROVIDER_SITE_OTHER): Admitting: Psychiatry

## 2015-07-31 VITALS — BP 118/68 | HR 67 | Temp 97.3°F | Ht 67.0 in | Wt 162.2 lb

## 2015-07-31 DIAGNOSIS — F3160 Bipolar disorder, current episode mixed, unspecified: Secondary | ICD-10-CM | POA: Diagnosis not present

## 2015-07-31 DIAGNOSIS — F5101 Primary insomnia: Secondary | ICD-10-CM

## 2015-07-31 MED ORDER — AMITRIPTYLINE HCL 25 MG PO TABS: 25.0000 mg | ORAL_TABLET | Freq: Every day | ORAL | Status: DC

## 2015-07-31 NOTE — Progress Notes (Signed)
Psychiatric MD Follow Up Note   Patient Identification: Kelly Vaughn MRN:  [id number] Date of Evaluation:  07/31/2015 Referral Source: Lamonte Sakai, M.D Chief Complaint:   Chief Complaint    Insomnia; Anxiety; Follow-up; Medication Refill     Visit Diagnosis:    ICD-9-CM ICD-10-CM   1. Bipolar I disorder, most recent episode mixed ([place]) 296.60 F31.60   2. Primary insomnia 307.42 F51.01    Diagnosis:   Patient Active Problem List   Diagnosis Date Noted  . Allergic state [T78.40XA] 04/15/2015  . Acid reflux [K21.9] 04/15/2015  . 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 Cephalalgia [R51] 04/15/2015  . History of colon polyps [Z86.010] 04/15/2015  . Adiposity [E66.9] 04/15/2015  . 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 Insomnia related to another mental disorder [F48.9] 04/16/2014  . Breast calcifications on mammogram [R92.8] 03/26/2014  . Abnormal finding on breast imaging [R92.8] 03/26/2014  . Cognitive complaints [R41.9] 02/13/2014  . Encounter for preventive health examination [Z00.00] 02/02/2014  . Memory deficits [R41.3] 02/02/2014  . Amnesia [R41.3] 02/02/2014  . Encounter for general adult medical examination without abnormal findings [Z00.00] 02/02/2014  . Murmur, heart [R01.1] 05/11/2013  . Palpitations [R00.2] 05/11/2013  . Cardiac murmur [R01.1] 05/11/2013  . Unspecified hypothyroidism [E03.9] 11/12/2012  . Menopausal syndrome [N95.1] 11/12/2012  . 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 History of whiplash injury [Z87.828] 11/12/2012  . Adult hypothyroidism [E03.9] 11/12/2012  . Arthritis, degenerative [M19.90] 11/12/2012  . H/O injury, presenting hazards to health [facility] 11/12/2012  . Menopausal symptom [N95.1] 11/12/2012  . allergic rhinitis [T78.40XA]   . Bipolar I disorder, most recent episode depressed ([place]) [F31.30]   . Obesity, unspecified [E66.9]   . Headache disorder [R51]   . GERD  (gastroesophageal reflux disease) [K21.9]   . Hyperlipidemia [E78.5]   . Urinary incontinence [R32]   . Hx of colonic polyps [Z86.010]   . Basal cell carcinoma [C44.91]    History of Present Illness:  Pt is a 59 year old married female who presented for the follow-up appointment. She reported that she continues to have problems with insomnia. She reported that she has been taking trazodone at night along with the melatonin but it does not help with her sleep. She is also having constipation related to the trazodone use. Patient reported that she is interested in changing  her medications at this time. She has been taking Abilify in the morning. Patient currently denied having any mood swings and her depressive symptoms are improving. She appeared pleasant and cooperative during the interview. She denied having any adverse effects related to her medications except the constipation. She also takes a lot of over-the-counter vitamins and herbs. She is not interested in changing those medications. Discussed her at length about the medications and the adverse effects and also the interactions between herself and her medications and she demonstrated understanding.  Past Medical History:  Past Medical History  Diagnosis Date  . allergic rhinitis   . Depression   . Eating disorder   . Headache disorder   . GERD (gastroesophageal reflux disease)   . Hyperlipidemia   . hypothyroidism   . Urinary incontinence   . Hx of colonic polyps   . Basal cell carcinoma     Past Surgical History  Procedure Laterality Date  . Cesarean section  1985  and 1989  . Cholecystectomy  2010  . Breast biopsy  2012  . Tonsillectomy and adenoidectomy  1960  . Cervical ablation  Family History:  Family History  Problem Relation Age of Onset  . Cancer Sister     breast  . Cancer Sister     pancreatic  . Cancer Sister     cervical  . Mental illness Mother   . Alcohol abuse Mother   . Arthritis Mother   .  Heart disease Father   . Hyperlipidemia Father   . Mental illness Maternal Aunt   . Arthritis Maternal Grandmother    Social History:   Social History   Social History  . Marital Status: Married    Spouse Name: N/A  . Number of Children: N/A  . Years of Education: N/A   Social History Main Topics  . Smoking status: Never Smoker   . Smokeless tobacco: Never Used  . Alcohol Use: 0.0 - 0.6 oz/week    0-1 Glasses of wine, 0 Shots of liquor, 0 Cans of beer per week     Comment: occassional  . Drug Use: No  . Sexual Activity: Yes    Birth Control/ Protection: None   Other Topics Concern  . None   Social History Narrative   Additional Social History:   she has moved to [place] to be close to her daughter. 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  Musculoskeletal: Strength & Muscle Tone: within normal limits Gait & Station: normal Patient leans: N/A  Psychiatric Specialty Exam: Insomnia PMH includes: depression.  Anxiety Symptoms include insomnia and nervous/anxious behavior.      Review of Systems  Psychiatric/Behavioral: Positive for depression. The patient is nervous/anxious and has insomnia.   All other systems reviewed and are negative.   Blood pressure 118/68, pulse 67, temperature 97.3 F (36.3 C), temperature source Tympanic, height 5\' 7"  (1.702 m), weight 162 lb 3.2 oz (73.573 kg), SpO2 95 %.Body mass index is 25.4 kg/(m^2).  General Appearance: Casual  Eye Contact:  Fair  Speech:  Clear and Coherent  Volume:  Normal  Mood:  Anxious  Affect:  Full Range  Thought Process:  Coherent and Goal Directed  Orientation:  Full (Time, Place, and Person)  Thought Content:  WDL  Suicidal Thoughts:  No  Homicidal Thoughts:  No  Memory:  Immediate;   Fair  Judgement:  Fair  Insight:  Fair  Psychomotor Activity:  Normal  Concentration:  Fair  Recall:  [employer] of Knowledge:Fair  Language: Fair  Akathisia:  No  Handed:   Ambidextrous  AIMS (if indicated):    Assets:  Communication Skills Desire for Improvement Physical Health Social Support  ADL's:  Intact  Cognition: WNL  Sleep:      Allergies:  No Known Allergies Current Medications: Current Outpatient Prescriptions  Medication Sig Dispense Refill  . Alpha-Lipoic Acid 300 MG CAPS Take 1 capsule by mouth daily.    . ARIPiprazole (ABILIFY) 2 MG tablet Take 1 tablet (2 mg total) by mouth daily. 90 tablet 1  . ARMOUR THYROID 60 MG tablet TAKE 1 TABLET DAILY 90 tablet 0  . Ascorbic Acid (VITAMIN C) 1000 MG tablet Take 1,000 mg by mouth daily.    . Biotin 5000 MCG CAPS Take 1 capsule by mouth daily.    . Black Cohosh (BLACK COHOSH HOT FLASH RELIEF) 40 MG CAPS Take by mouth.    . celecoxib (CELEBREX) 200 MG capsule Take 200 mg by mouth as needed.    . CHELATED CALCIUM PO Take 1,200 mg by mouth daily.    . Chelated Zinc  50 MG TABS Take 1 tablet by mouth daily.    . Cholecalciferol (VITAMIN D) 2000 UNITS CAPS Take 2 capsules by mouth daily.    . Coenzyme Q10 (CO Q-10) 100 MG CAPS Take 1 capsule by mouth daily.    . Estradiol (VAGIFEM) 10 MCG TABS Insert 1 tablet by vaginal route once daily for 14 days then 1 tablet 2 times per week for duration of use.    Marland Kitchen FLUoxetine (PROZAC) 10 MG capsule Take 1 capsule (10 mg total) by mouth daily. 90 capsule 3  . Lactobacillus (PROBIOTIC ACIDOPHILUS PO) Take 32 mg by mouth daily.    . NON FORMULARY Kyolic Garlic 0000000 daily.    . OMEGA 3 1200 MG CAPS Take 2 capsules by mouth daily.    . Potassium 99 MG TABS Take 1 tablet by mouth daily.    . Red Yeast Rice Extract (RED YEAST RICE PO) Take 1,200 mg by mouth daily.    Marland Kitchen ROZEREM 8 MG tablet Take 8 mg by mouth at bedtime.  1  . simvastatin (ZOCOR) 40 MG tablet TAKE 1/2 TABLET DAILY AS DIRECTED  2  . traZODone (DESYREL) 100 MG tablet Take 2 tablets (200 mg total) by mouth at bedtime. 180 tablet 3  . TURMERIC CURCUMIN PO Take 800 mg by mouth daily.    Marland Kitchen zinc gluconate 50  MG tablet Take by mouth.     No current facility-administered medications for this visit.    Previous Psychotropic Medications:  She has tried several psycho topic medications in the past including lithium lamotrigine Depakote Seroquel Prozac Ativan   Medical Decision Making:  Review of Psycho-Social Stressors (1) and Review of Medication Regimen & Side Effects (2)  Treatment Plan Summary: Medication management   Depression Patient is currently on Prozac 10 mg and she is feeling stable on the medication  Insomnia I will start her on amitriptyline 25 mg at bedtime for insomnia and discussed with her about the adverse effects including risk of constipation as well as cardiac side effects. She reported that she was recently evaluated by the cardiologist and full workup was done and she does not have any cardiac issues at this time.   Mood symptoms Patient is currently on Abilify  2 mg.   All her medications were refilled for the next [facility]  She will follow-up in a month.    Discussed with patient about the medications      More than 50% of the time spent in psychoeducation, counseling and coordination of care.  Time spent 25 mins    This note was generated in part or whole with voice recognition software. Voice regonition is usually quite accurate but there are transcription errors that can and very often do occur. I apologize for any typographical errors that were not detected and corrected.    Alverda Nazzaro 11/10/201610:31 AM

## 2015-08-04 ENCOUNTER — Telehealth: Payer: Self-pay | Admitting: Psychiatry

## 2015-08-05 NOTE — Telephone Encounter (Signed)
pt called again,

## 2015-08-05 NOTE — Telephone Encounter (Signed)
Spoke with Pt and she stated she is not sleeping well. She doubled up on Amitriptyline and was having racing heart, headache and she is not willing to continue with the medication. She stated that she will continue with Trazodone and has plenty of pills at home. She denied having adverse effects with Trazodone. She stated that wakes up often. She denied SI/HI or plans.

## 2015-08-26 ENCOUNTER — Encounter: Payer: Self-pay | Admitting: Psychiatry

## 2015-08-26 ENCOUNTER — Ambulatory Visit (INDEPENDENT_AMBULATORY_CARE_PROVIDER_SITE_OTHER): Admitting: Psychiatry

## 2015-08-26 VITALS — BP 122/78 | HR 64 | Temp 98.0°F | Ht 67.0 in | Wt 167.4 lb

## 2015-08-26 DIAGNOSIS — F3160 Bipolar disorder, current episode mixed, unspecified: Secondary | ICD-10-CM | POA: Diagnosis not present

## 2015-08-26 MED ORDER — FLUOXETINE HCL 10 MG PO CAPS: 10.0000 mg | ORAL_CAPSULE | Freq: Every day | ORAL | Status: DC

## 2015-08-26 MED ORDER — SUVOREXANT 15 MG PO TABS: 15.0000 mg | ORAL_TABLET | Freq: Every day | ORAL | Status: DC

## 2015-08-26 MED ORDER — ARIPIPRAZOLE 2 MG PO TABS: 2.0000 mg | ORAL_TABLET | Freq: Every day | ORAL | Status: DC

## 2015-08-26 NOTE — Progress Notes (Signed)
Psychiatric MD Follow Up Note   Patient Identification: Kelly Vaughn MRN:  [id number] Date of Evaluation:  08/26/2015 Referral Source: Lamonte Sakai, M.D Chief Complaint:   Chief Complaint    Follow-up; Medication Refill     Visit Diagnosis:    ICD-9-CM ICD-10-CM   1. Bipolar I disorder, most recent episode mixed ([place]) 296.60 F31.60    Diagnosis:   Patient Active Problem List   Diagnosis Date Noted  . Allergic state [T78.40XA] 04/15/2015  . Acid reflux [K21.9] 04/15/2015  . 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 Cephalalgia [R51] 04/15/2015  . History of colon polyps [Z86.010] 04/15/2015  . Adiposity [E66.9] 04/15/2015  . 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 Insomnia related to another mental disorder [F48.9] 04/16/2014  . Breast calcifications on mammogram [R92.8] 03/26/2014  . Abnormal finding on breast imaging [R92.8] 03/26/2014  . Cognitive complaints [R41.9] 02/13/2014  . Encounter for preventive health examination [Z00.00] 02/02/2014  . Memory deficits [R41.3] 02/02/2014  . Amnesia [R41.3] 02/02/2014  . Encounter for general adult medical examination without abnormal findings [Z00.00] 02/02/2014  . Murmur, heart [R01.1] 05/11/2013  . Palpitations [R00.2] 05/11/2013  . Cardiac murmur [R01.1] 05/11/2013  . Unspecified hypothyroidism [E03.9] 11/12/2012  . Menopausal syndrome [N95.1] 11/12/2012  . 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 History of whiplash injury [Z87.828] 11/12/2012  . Adult hypothyroidism [E03.9] 11/12/2012  . Arthritis, degenerative [M19.90] 11/12/2012  . H/O injury, presenting hazards to health [facility] 11/12/2012  . Menopausal symptom [N95.1] 11/12/2012  . allergic rhinitis [T78.40XA]   . Bipolar I disorder, most recent episode depressed ([place]) [F31.30]   . Obesity, unspecified [E66.9]   . Headache disorder [R51]   . GERD (gastroesophageal reflux disease) [K21.9]   . Hyperlipidemia  [E78.5]   . Urinary incontinence [R32]   . Hx of colonic polyps [Z86.010]   . Basal cell carcinoma [C44.91]    History of Present Illness:  Pt is a 59 year old married female who presented for the follow-up appointment. She reported that she is doing well and is planning to go to the concert this evening with her husband. Patient appeared calm and collected during the interview. Patient reported that she is currently wearing a Holter for 48 hours due to history of rapid heartbeat in the past. She has been following with a cardiologist on a regular basis. She reported that she she has been very good about her she does not notice any irregularities in her heartbeat. That she has also discussed with the nurse practitioner at her primary care physician office and discussed about starting the [place] for insomnia as she continues to have problems with sleep. She has restarted back on trazodone and has been taking 100 mg at bedtime and then takes and her 100 mg in the middle of the night which has not been helpful. She is interested in taking a new medication. She currently denied having any mood swings anger anxiety or paranoia.    Past Medical History:  Past Medical History  Diagnosis Date  . allergic rhinitis   . Depression   . Eating disorder   . Headache disorder   . GERD (gastroesophageal reflux disease)   . Hyperlipidemia   . hypothyroidism   . Urinary incontinence   . Hx of colonic polyps   . Basal cell carcinoma     Past Surgical History  Procedure Laterality Date  . Cesarean section  1985  and 1989  . Cholecystectomy  2010  . Breast  biopsy  2012  . Tonsillectomy and adenoidectomy  1960  . Cervical ablation     Family History:  Family History  Problem Relation Age of Onset  . Cancer Sister     breast  . Cancer Sister     pancreatic  . Cancer Sister     cervical  . Mental illness Mother   . Alcohol abuse Mother   . Arthritis Mother   . Heart disease Father   .  Hyperlipidemia Father   . Mental illness Maternal Aunt   . Arthritis Maternal Grandmother    Social History:   Social History   Social History  . Marital Status: Married    Spouse Name: N/A  . Number of Children: N/A  . Years of Education: N/A   Social History Main Topics  . Smoking status: Never Smoker   . Smokeless tobacco: Never Used  . Alcohol Use: 0.0 - 0.6 oz/week    0-1 Glasses of wine, 0 Shots of liquor, 0 Cans of beer per week     Comment: occassional  . Drug Use: No  . Sexual Activity: Yes    Birth Control/ Protection: None   Other Topics Concern  . None   Social History Narrative   Additional Social History:   she has moved to [place] to be close to her daughter. 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  Musculoskeletal: Strength & Muscle Tone: within normal limits Gait & Station: normal Patient leans: N/A  Psychiatric Specialty Exam: Replace with your form name PMH includes: depression.  Anxiety Symptoms include insomnia and nervous/anxious behavior.      Review of Systems  Psychiatric/Behavioral: Positive for depression. The patient is nervous/anxious and has insomnia.   All other systems reviewed and are negative.   Blood pressure 122/78, pulse 64, temperature 98 F (36.7 C), temperature source Tympanic, height 5\' 7"  (1.702 m), weight 167 lb 6.4 oz (75.932 kg), SpO2 96 %.Body mass index is 26.21 kg/(m^2).  General Appearance: Casual  Eye Contact:  Fair  Speech:  Clear and Coherent  Volume:  Normal  Mood:  Anxious  Affect:  Full Range  Thought Process:  Coherent and Goal Directed  Orientation:  Full (Time, Place, and Person)  Thought Content:  WDL  Suicidal Thoughts:  No  Homicidal Thoughts:  No  Memory:  Immediate;   Fair  Judgement:  Fair  Insight:  Fair  Psychomotor Activity:  Normal  Concentration:  Fair  Recall:  [employer] of Knowledge:Fair  Language: Fair  Akathisia:  No  Handed:  Ambidextrous   AIMS (if indicated):    Assets:  Communication Skills Desire for Improvement Physical Health Social Support  ADL's:  Intact  Cognition: WNL  Sleep:      Allergies:  No Known Allergies Current Medications: Current Outpatient Prescriptions  Medication Sig Dispense Refill  . Alpha-Lipoic Acid 300 MG CAPS Take 1 capsule by mouth daily.    Marland Kitchen amitriptyline (ELAVIL) 25 MG tablet Take 1 tablet (25 mg total) by mouth at bedtime. 30 tablet 1  . ARIPiprazole (ABILIFY) 2 MG tablet Take 1 tablet (2 mg total) by mouth daily. 90 tablet 1  . ARMOUR THYROID 60 MG tablet TAKE 1 TABLET DAILY 90 tablet 0  . Ascorbic Acid (VITAMIN C) 1000 MG tablet Take 1,000 mg by mouth daily.    . Biotin 5000 MCG CAPS Take 1 capsule by mouth daily.    . celecoxib (CELEBREX) 200 MG capsule Take  200 mg by mouth as needed.    . CHELATED CALCIUM PO Take 1,200 mg by mouth daily.    . Chelated Zinc 50 MG TABS Take 1 tablet by mouth daily.    . Cholecalciferol (VITAMIN D) 2000 UNITS CAPS Take 2 capsules by mouth daily.    . Coenzyme Q10 (CO Q-10) 100 MG CAPS Take 1 capsule by mouth daily.    . Estradiol (VAGIFEM) 10 MCG TABS Insert 1 tablet by vaginal route once daily for 14 days then 1 tablet 2 times per week for duration of use.    Marland Kitchen FLUoxetine (PROZAC) 10 MG capsule Take 1 capsule (10 mg total) by mouth daily. 90 capsule 3  . Lactobacillus (PROBIOTIC ACIDOPHILUS PO) Take 32 mg by mouth daily.    . NON FORMULARY Kyolic Garlic 0000000 daily.    . OMEGA 3 1200 MG CAPS Take 2 capsules by mouth daily.    . Potassium 99 MG TABS Take 1 tablet by mouth daily.    . Red Yeast Rice Extract (RED YEAST RICE PO) Take 1,200 mg by mouth daily.    . simvastatin (ZOCOR) 20 MG tablet Take 20 mg by mouth daily.    . TURMERIC CURCUMIN PO Take 800 mg by mouth daily.    Marland Kitchen zinc gluconate 50 MG tablet Take by mouth.     No current facility-administered medications for this visit.    Previous Psychotropic Medications:  She has tried several  psycho topic medications in the past including lithium lamotrigine Depakote Seroquel Prozac Ativan   Medical Decision Making:  Review of Psycho-Social Stressors (1) and Review of Medication Regimen & Side Effects (2)  Treatment Plan Summary: Medication management   Depression Patient is currently on Prozac 10 mg and she is feeling stable on the medication. Advised her to take Prozac 10 mg on alternate days as she feels that it is affecting her sleep.  Insomnia A started her on Belsomra 15mg  po qhs . Samples for 21  day supply was given.   Mood symptoms Patient is currently on Abilify  2 mg.     All her medications were [facility]illed for the next [facility] days [facility]  She will follow-up in 3 months    Discussed with patient about the medications      More than 50% of the time spent in psychoeducation, counseling and coordination of care.  Time spent 25 mins    This note was generated in part or whole with voice recognition software. Voice regonition is usually quite accurate but there are transcription errors that can and very often do occur. I apologize for any typographical errors that were not detected and corrected.    Yoland Scherr 12/6/201610:31 AM

## 2015-09-17 NOTE — Progress Notes (Signed)
Pharmacy notified.

## 2015-09-17 NOTE — Progress Notes (Signed)
refill 

## 2015-10-03 ENCOUNTER — Telehealth: Payer: Self-pay

## 2015-10-03 NOTE — Telephone Encounter (Signed)
pt called she states that she would like to taper off the abilify and she would like for you to send in abilify in the "liquid form" because the tablets are too small to cut in have.  [facility]

## 2015-10-07 NOTE — Telephone Encounter (Signed)
Called pt. She has started taking Abilify 1mg  for the past 4 days. She was reading about the medication and since she is tapering, she was looking for 0.5 mg dose. Advised her to take Abilify 1mg  on alternate days for a week and then stop. It has long half life and she will not experience any withdrawal experience. She demonstrated understanding.  Will follow up as scheduled.

## 2015-11-11 ENCOUNTER — Other Ambulatory Visit: Payer: Self-pay | Admitting: Psychiatry

## 2015-11-25 ENCOUNTER — Ambulatory Visit: Admitting: Psychiatry

## 2015-12-22 DIAGNOSIS — G8929 Other chronic pain: Secondary | ICD-10-CM | POA: Insufficient documentation

## 2015-12-22 DIAGNOSIS — M79675 Pain in left toe(s): Secondary | ICD-10-CM | POA: Insufficient documentation

## 2016-02-06 ENCOUNTER — Other Ambulatory Visit: Payer: Self-pay

## 2016-02-06 DIAGNOSIS — Z1231 Encounter for screening mammogram for malignant neoplasm of breast: Secondary | ICD-10-CM

## 2016-03-08 ENCOUNTER — Ambulatory Visit
Admission: RE | Admit: 2016-03-08 | Discharge: 2016-03-08 | Disposition: A | Discharge status: Home / Self Care | Source: Ambulatory Visit

## 2016-03-08 DIAGNOSIS — Z1231 Encounter for screening mammogram for malignant neoplasm of breast: Secondary | ICD-10-CM

## 2016-05-25 ENCOUNTER — Ambulatory Visit: Admitting: Oncology

## 2016-06-06 DIAGNOSIS — D72819 Decreased white blood cell count, unspecified: Secondary | ICD-10-CM | POA: Insufficient documentation

## 2016-06-06 DIAGNOSIS — D696 Thrombocytopenia, unspecified: Secondary | ICD-10-CM | POA: Insufficient documentation

## 2016-06-06 NOTE — Progress Notes (Signed)
[facility]  [phone] [phone]) [phone]  ID: Kelly Vaughn OB: 28-Jun-1956  MR#: [id number]  [id number]  Patient Care Team: Crecencio Mc, MD as PCP - General (Internal Medicine)  CHIEF COMPLAINT: Leukopenia, thrombocytopenia  INTERVAL HISTORY: Patient is a 60 year old female who was found to have a decreased white blood cell count and platelet count on routine blood work. Repeat testing confirmed the results. Currently, she feels well and is asymptomatic. She does not complain of any fevers or illnesses. She denies any easy bleeding or bruising. She has no neurologic complaints. She has a good appetite and denies weight loss. She has no chest pain or shortness of breath. She denies any nausea, vomiting, constipation, or diarrhea. She has no urinary complaints. Patient feels at her baseline and offers no specific complaints today.  REVIEW OF SYSTEMS:   Review of Systems  Constitutional: Negative.  Negative for fever, malaise/fatigue and weight loss.  Respiratory: Negative.  Negative for cough and shortness of breath.   Cardiovascular: Negative.  Negative for chest pain and leg swelling.  Gastrointestinal: Negative.  Negative for abdominal pain, blood in stool and melena.  Genitourinary: Negative.   Musculoskeletal: Negative.   Neurological: Negative.  Negative for weakness.  Endo/Heme/Allergies: Does not bruise/bleed easily.  Psychiatric/Behavioral: Negative.  The patient is not nervous/anxious.     As per HPI. Otherwise, a complete review of systems is negative.  PAST MEDICAL HISTORY: Past Medical History:  Diagnosis Date  . allergic rhinitis   . Basal cell carcinoma   . Depression   . Eating disorder   . GERD (gastroesophageal reflux disease)   . Headache disorder   . Heart murmur   . Hx of colonic polyps   . Hyperlipidemia   . hypothyroidism   . Urinary incontinence     PAST SURGICAL HISTORY: Past Surgical History:  Procedure  Laterality Date  . BREAST BIOPSY  2012  . CERVICAL ABLATION    . [place]  . CHOLECYSTECTOMY  2010  . TONSILLECTOMY AND ADENOIDECTOMY  1960    FAMILY HISTORY: Family History  Problem Relation Age of Onset  . Cancer Sister     breast  . Cancer Sister     pancreatic  . Cancer Sister     cervical  . Mental illness Mother   . Alcohol abuse Mother   . Arthritis Mother   . Heart disease Father   . Hyperlipidemia Father   . Mental illness Maternal Aunt   . Arthritis Maternal Grandmother     ADVANCED DIRECTIVES (Y/N):  N  HEALTH MAINTENANCE: Social History  Substance Use Topics  . Smoking status: Never Smoker  . Smokeless tobacco: Never Used  . Alcohol use 0.0 - 0.6 oz/week     Comment: occassional     Colonoscopy:  PAP:  Bone density:  Lipid panel:  No Known Allergies  Current Outpatient Prescriptions  Medication Sig Dispense Refill  . Alpha-Lipoic Acid 300 MG CAPS Take 1 capsule by mouth daily.    Francia Greaves THYROID 60 MG tablet TAKE 1 TABLET DAILY 90 tablet 0  . Ascorbic Acid (VITAMIN C) 1000 MG tablet Take 1,000 mg by mouth daily.    . Biotin 5000 MCG CAPS Take 1 capsule by mouth daily.    . celecoxib (CELEBREX) 200 MG capsule Take 200 mg by mouth as needed.    . celecoxib (CELEBREX) 200 MG capsule TAKE 1 CAPSULE once a Day    . CHELATED  CALCIUM PO Take 1,200 mg by mouth daily.    . Chelated Zinc 50 MG TABS Take 1 tablet by mouth daily.    . Cholecalciferol (VITAMIN D) 2000 UNITS CAPS Take 2 capsules by mouth daily.    . Coenzyme Q10 (CO Q-10) 100 MG CAPS Take 1 capsule by mouth daily.    . Estradiol (VAGIFEM) 10 MCG TABS Insert 1 tablet by vaginal route once daily for 14 days then 1 tablet 2 times per week for duration of use.    . Lactobacillus (PROBIOTIC ACIDOPHILUS PO) Take 32 mg by mouth daily.    . OMEGA 3 1200 MG CAPS Take 2 capsules by mouth daily.    . sertraline (ZOLOFT) 100 MG tablet     . simvastatin (ZOCOR) 20 MG tablet Take  20 mg by mouth daily.    . TURMERIC CURCUMIN PO Take 800 mg by mouth daily.     No current facility-administered medications for this visit.     OBJECTIVE: Vitals:   06/07/16 1535  BP: 111/73  Pulse: 70  Resp: 18  Temp: 97.3 F (36.3 C)     Body mass index is 28.38 kg/m.    ECOG FS:0 - Asymptomatic  General: Well-developed, well-nourished, no acute distress. Eyes: Pink conjunctiva, anicteric sclera. HEENT: Normocephalic, moist mucous membranes, clear oropharnyx. Lungs: Clear to auscultation bilaterally. Heart: Regular rate and rhythm. No rubs, murmurs, or gallops. Abdomen: Soft, nontender, nondistended. No organomegaly noted, normoactive bowel sounds. Musculoskeletal: No edema, cyanosis, or clubbing. Neuro: Alert, answering all questions appropriately. Cranial nerves grossly intact. Skin: No rashes or petechiae noted. Psych: Normal affect. Lymphatics: No cervical, calvicular, axillary or inguinal LAD.   LAB RESULTS:  Lab Results  Component Value Date   NA 141 01/17/2014   K 4.3 01/17/2014   CL 105 01/17/2014   CO2 31 01/17/2014   GLUCOSE 90 01/17/2014   BUN 14 01/17/2014   CREATININE 0.7 01/17/2014   CALCIUM 9.4 01/17/2014   PROT 6.8 01/17/2014   ALBUMIN 4.4 01/17/2014   AST 14 01/17/2014   ALT 13 01/17/2014   ALKPHOS 52 01/17/2014   BILITOT 0.4 01/17/2014    Lab Results  Component Value Date   WBC 5.1 06/07/2016   NEUTROABS 1.8 01/17/2014   HGB 14.3 06/07/2016   HCT 42.8 06/07/2016   MCV 91.4 06/07/2016   PLT 143 (L) 06/07/2016   Lab Results  Component Value Date   IRON 68 06/07/2016   TIBC 345 06/07/2016   IRONPCTSAT 20 06/07/2016   Lab Results  Component Value Date   FERRITIN 70 06/07/2016     STUDIES: No results found.  ASSESSMENT: Leukopenia, thrombocytopenia  PLAN:    1. Leukopenia:  Resolved. Patient's white blood cell count is within normal limits today. ANA, neutrophil antibodies, and splenic ultrasound have been ordered and are  pending at time of dictation. No intervention is needed at this time. Follow-up was scheduled for 3 months, but patient has requested that if no distinct etiology can be determined that this appointment be canceled. 2. Thrombocytopenia: Mild. All of patient's laboratory work is either negative or within normal limits. SPEP, platelet antibodies, and ANA are pending at time of dictation. No intervention is needed at this time. Splenic ultrasound as above. Follow-up as above.  Patient expressed understanding and was in agreement with this plan. She also understands that She can call clinic at any time with any questions, concerns, or complaints.   No matching staging information was found for the patient.  Lloyd Huger, MD   06/07/2016 10:03 PM

## 2016-06-07 ENCOUNTER — Inpatient Hospital Stay: Attending: Oncology | Admitting: Oncology

## 2016-06-07 ENCOUNTER — Inpatient Hospital Stay

## 2016-06-07 ENCOUNTER — Other Ambulatory Visit: Payer: Self-pay

## 2016-06-07 ENCOUNTER — Encounter: Payer: Self-pay | Admitting: Oncology

## 2016-06-07 VITALS — BP 111/73 | HR 70 | Temp 97.3°F | Resp 18 | Ht 67.0 in | Wt 181.2 lb

## 2016-06-07 DIAGNOSIS — Z800 Family history of malignant neoplasm of digestive organs: Secondary | ICD-10-CM | POA: Diagnosis not present

## 2016-06-07 DIAGNOSIS — Z803 Family history of malignant neoplasm of breast: Secondary | ICD-10-CM | POA: Insufficient documentation

## 2016-06-07 DIAGNOSIS — Z85828 Personal history of other malignant neoplasm of skin: Secondary | ICD-10-CM | POA: Diagnosis not present

## 2016-06-07 DIAGNOSIS — Z8041 Family history of malignant neoplasm of ovary: Secondary | ICD-10-CM | POA: Diagnosis not present

## 2016-06-07 DIAGNOSIS — K219 Gastro-esophageal reflux disease without esophagitis: Secondary | ICD-10-CM | POA: Insufficient documentation

## 2016-06-07 DIAGNOSIS — E039 Hypothyroidism, unspecified: Secondary | ICD-10-CM | POA: Diagnosis not present

## 2016-06-07 DIAGNOSIS — R011 Cardiac murmur, unspecified: Secondary | ICD-10-CM | POA: Insufficient documentation

## 2016-06-07 DIAGNOSIS — E785 Hyperlipidemia, unspecified: Secondary | ICD-10-CM | POA: Insufficient documentation

## 2016-06-07 DIAGNOSIS — F509 Eating disorder, unspecified: Secondary | ICD-10-CM | POA: Insufficient documentation

## 2016-06-07 DIAGNOSIS — F329 Major depressive disorder, single episode, unspecified: Secondary | ICD-10-CM | POA: Insufficient documentation

## 2016-06-07 DIAGNOSIS — D696 Thrombocytopenia, unspecified: Secondary | ICD-10-CM | POA: Insufficient documentation

## 2016-06-07 DIAGNOSIS — Z8669 Personal history of other diseases of the nervous system and sense organs: Secondary | ICD-10-CM | POA: Insufficient documentation

## 2016-06-07 DIAGNOSIS — R32 Unspecified urinary incontinence: Secondary | ICD-10-CM | POA: Insufficient documentation

## 2016-06-07 DIAGNOSIS — D72819 Decreased white blood cell count, unspecified: Secondary | ICD-10-CM | POA: Diagnosis present

## 2016-06-07 DIAGNOSIS — Z86010 Personal history of colonic polyps: Secondary | ICD-10-CM | POA: Diagnosis not present

## 2016-06-07 DIAGNOSIS — Z79899 Other long term (current) drug therapy: Secondary | ICD-10-CM | POA: Diagnosis not present

## 2016-06-07 LAB — FOLATE: Folate: 28 ng/mL (ref >5.9)

## 2016-06-07 LAB — CBC
HCT: 42.8 % (ref 35.0–47.0)
Hemoglobin: 14.3 g/dL (ref 12.0–16.0)
MCH: 30.6 pg (ref 26.0–34.0)
MCHC: 33.5 g/dL (ref 32.0–36.0)
MCV: 91.4 fL (ref 80.0–100.0)
PLATELETS: 143 10*3/uL — AB (ref 150–440)
RBC: 4.68 MIL/uL (ref 3.80–5.20)
RDW: 14.1 % (ref 11.5–14.5)
WBC: 5.1 10*3/uL (ref 3.6–11.0)

## 2016-06-07 LAB — IRON AND TIBC
IRON: 68 ug/dL (ref 28–170)
Saturation Ratios: 20 % (ref 10.4–31.8)
TIBC: 345 ug/dL (ref 250–450)
UIBC: 277 ug/dL

## 2016-06-07 LAB — VITAMIN B12: VITAMIN B 12: 402 pg/mL (ref 180–914)

## 2016-06-07 LAB — LACTATE DEHYDROGENASE: LDH: 161 U/L (ref 98–192)

## 2016-06-07 LAB — FERRITIN: Ferritin: 70 ng/mL (ref 11–307)

## 2016-06-07 NOTE — Progress Notes (Signed)
Patient here today as new evaluation regarding low WBC's and platelets.  Referred by [facility].

## 2016-06-08 LAB — PROTEIN ELECTROPHORESIS, SERUM
A/G RATIO SPE: 1.9 — AB (ref 0.7–1.7)
ALBUMIN ELP: 4.6 g/dL — AB (ref 2.9–4.4)
ALPHA-1-GLOBULIN: 0.2 g/dL (ref 0.0–0.4)
Alpha-2-Globulin: 0.5 g/dL (ref 0.4–1.0)
BETA GLOBULIN: 0.9 g/dL (ref 0.7–1.3)
GAMMA GLOBULIN: 0.8 g/dL (ref 0.4–1.8)
Globulin, Total: 2.4 g/dL (ref 2.2–3.9)
TOTAL PROTEIN ELP: 7 g/dL (ref 6.0–8.5)

## 2016-06-08 LAB — PLATELET ANTIBODY PROFILE, SERUM
HLA AB SER QL EIA: NEGATIVE
IA/IIA Antibody: NEGATIVE
IB/IX Antibody: NEGATIVE
IIB/IIIA ANTIBODY: NEGATIVE

## 2016-06-08 LAB — ANA W/REFLEX: Anti Nuclear Antibody(ANA): NEGATIVE

## 2016-06-08 LAB — HAPTOGLOBIN: HAPTOGLOBIN: 55 mg/dL (ref 34–200)

## 2016-06-16 ENCOUNTER — Ambulatory Visit
Admission: RE | Admit: 2016-06-16 | Discharge: 2016-06-16 | Disposition: A | Discharge status: Home / Self Care | Source: Ambulatory Visit | Attending: Oncology | Admitting: Oncology

## 2016-06-16 ENCOUNTER — Other Ambulatory Visit: Payer: Self-pay | Admitting: Oncology

## 2016-06-16 DIAGNOSIS — D72819 Decreased white blood cell count, unspecified: Secondary | ICD-10-CM

## 2016-06-16 DIAGNOSIS — D696 Thrombocytopenia, unspecified: Secondary | ICD-10-CM | POA: Diagnosis present

## 2016-06-16 LAB — NEUTROPHIL AB TEST LEVEL 1: NEUTROPHIL SCR/PANEL INTERP.: NEGATIVE

## 2016-07-01 ENCOUNTER — Encounter: Payer: Self-pay | Admitting: Oncology

## 2016-09-08 ENCOUNTER — Ambulatory Visit: Admitting: Oncology

## 2016-09-08 ENCOUNTER — Other Ambulatory Visit

## 2017-01-26 ENCOUNTER — Other Ambulatory Visit: Payer: Self-pay | Admitting: Internal Medicine

## 2017-01-26 ENCOUNTER — Other Ambulatory Visit: Payer: Self-pay | Admitting: Nurse Practitioner

## 2017-01-26 DIAGNOSIS — Z1231 Encounter for screening mammogram for malignant neoplasm of breast: Secondary | ICD-10-CM

## 2017-03-11 ENCOUNTER — Ambulatory Visit
Admission: RE | Admit: 2017-03-11 | Discharge: 2017-03-11 | Disposition: A | Discharge status: Home / Self Care | Source: Ambulatory Visit | Attending: Nurse Practitioner | Admitting: Nurse Practitioner

## 2017-03-11 DIAGNOSIS — Z1231 Encounter for screening mammogram for malignant neoplasm of breast: Secondary | ICD-10-CM

## 2017-05-07 DIAGNOSIS — I5181 Takotsubo syndrome: Secondary | ICD-10-CM | POA: Insufficient documentation

## 2017-05-07 DIAGNOSIS — I214 Non-ST elevation (NSTEMI) myocardial infarction: Secondary | ICD-10-CM | POA: Insufficient documentation

## 2018-02-06 ENCOUNTER — Other Ambulatory Visit: Payer: Self-pay | Admitting: Nurse Practitioner

## 2018-02-06 DIAGNOSIS — Z1231 Encounter for screening mammogram for malignant neoplasm of breast: Secondary | ICD-10-CM

## 2018-03-15 ENCOUNTER — Ambulatory Visit
Admission: RE | Admit: 2018-03-15 | Discharge: 2018-03-15 | Disposition: A | Discharge status: Home / Self Care | Source: Ambulatory Visit | Attending: Nurse Practitioner | Admitting: Nurse Practitioner

## 2018-03-15 DIAGNOSIS — Z1231 Encounter for screening mammogram for malignant neoplasm of breast: Secondary | ICD-10-CM

## 2018-03-16 ENCOUNTER — Other Ambulatory Visit: Payer: Self-pay | Admitting: Nurse Practitioner

## 2018-03-16 DIAGNOSIS — R928 Other abnormal and inconclusive findings on diagnostic imaging of breast: Secondary | ICD-10-CM

## 2018-03-20 ENCOUNTER — Ambulatory Visit
Admission: RE | Admit: 2018-03-20 | Discharge: 2018-03-20 | Disposition: A | Discharge status: Home / Self Care | Source: Ambulatory Visit | Attending: Nurse Practitioner | Admitting: Nurse Practitioner

## 2018-03-20 DIAGNOSIS — R928 Other abnormal and inconclusive findings on diagnostic imaging of breast: Secondary | ICD-10-CM

## 2018-04-24 ENCOUNTER — Encounter: Payer: Self-pay | Admitting: *Deleted

## 2018-04-25 ENCOUNTER — Other Ambulatory Visit: Payer: Self-pay

## 2018-04-25 ENCOUNTER — Ambulatory Visit
Admission: RE | Admit: 2018-04-25 | Discharge: 2018-04-25 | Disposition: A | Discharge status: Home / Self Care | Source: Ambulatory Visit | Attending: Internal Medicine | Admitting: Internal Medicine

## 2018-04-25 ENCOUNTER — Ambulatory Visit: Admitting: Certified Registered"

## 2018-04-25 ENCOUNTER — Encounter
Admission: RE | Disposition: A | Discharge status: Home / Self Care | Payer: Self-pay | Source: Ambulatory Visit | Attending: Internal Medicine

## 2018-04-25 ENCOUNTER — Encounter: Payer: Self-pay | Admitting: Student

## 2018-04-25 DIAGNOSIS — Z85828 Personal history of other malignant neoplasm of skin: Secondary | ICD-10-CM | POA: Diagnosis not present

## 2018-04-25 DIAGNOSIS — F5105 Insomnia due to other mental disorder: Secondary | ICD-10-CM | POA: Insufficient documentation

## 2018-04-25 DIAGNOSIS — K640 First degree hemorrhoids: Secondary | ICD-10-CM | POA: Insufficient documentation

## 2018-04-25 DIAGNOSIS — F419 Anxiety disorder, unspecified: Secondary | ICD-10-CM | POA: Diagnosis not present

## 2018-04-25 DIAGNOSIS — F319 Bipolar disorder, unspecified: Secondary | ICD-10-CM | POA: Diagnosis not present

## 2018-04-25 DIAGNOSIS — Z8371 Family history of colonic polyps: Secondary | ICD-10-CM | POA: Insufficient documentation

## 2018-04-25 DIAGNOSIS — E669 Obesity, unspecified: Secondary | ICD-10-CM | POA: Insufficient documentation

## 2018-04-25 DIAGNOSIS — I422 Other hypertrophic cardiomyopathy: Secondary | ICD-10-CM | POA: Diagnosis not present

## 2018-04-25 DIAGNOSIS — Z79890 Hormone replacement therapy (postmenopausal): Secondary | ICD-10-CM | POA: Insufficient documentation

## 2018-04-25 DIAGNOSIS — E785 Hyperlipidemia, unspecified: Secondary | ICD-10-CM | POA: Diagnosis not present

## 2018-04-25 DIAGNOSIS — Z1211 Encounter for screening for malignant neoplasm of colon: Secondary | ICD-10-CM | POA: Diagnosis not present

## 2018-04-25 DIAGNOSIS — Z6827 Body mass index (BMI) 27.0-27.9, adult: Secondary | ICD-10-CM | POA: Insufficient documentation

## 2018-04-25 DIAGNOSIS — I252 Old myocardial infarction: Secondary | ICD-10-CM | POA: Insufficient documentation

## 2018-04-25 DIAGNOSIS — E039 Hypothyroidism, unspecified: Secondary | ICD-10-CM | POA: Diagnosis not present

## 2018-04-25 DIAGNOSIS — Z86010 Personal history of colonic polyps: Secondary | ICD-10-CM | POA: Insufficient documentation

## 2018-04-25 DIAGNOSIS — Z79899 Other long term (current) drug therapy: Secondary | ICD-10-CM | POA: Insufficient documentation

## 2018-04-25 DIAGNOSIS — K219 Gastro-esophageal reflux disease without esophagitis: Secondary | ICD-10-CM | POA: Diagnosis not present

## 2018-04-25 HISTORY — DX: Hypothyroidism, unspecified: E03.9

## 2018-04-25 HISTORY — PX: COLONOSCOPY WITH PROPOFOL: SHX5780

## 2018-04-25 HISTORY — DX: Anxiety disorder, unspecified: F41.9

## 2018-04-25 SURGERY — COLONOSCOPY WITH PROPOFOL
Anesthesia: General

## 2018-04-25 MED ORDER — GLYCOPYRROLATE 0.2 MG/ML IJ SOLN
INTRAMUSCULAR | Status: DC | PRN
  Administered 2018-04-25 (×2): 0.1 mg via INTRAVENOUS

## 2018-04-25 MED ORDER — PROPOFOL 10 MG/ML IV BOLUS
INTRAVENOUS | Status: DC | PRN
  Administered 2018-04-25: 100 mg via INTRAVENOUS

## 2018-04-25 MED ORDER — SODIUM CHLORIDE 0.9 % IV SOLN
INTRAVENOUS | Status: DC
  Administered 2018-04-25: 10:00:00 via INTRAVENOUS

## 2018-04-25 MED ORDER — PROPOFOL 500 MG/50ML IV EMUL
INTRAVENOUS | Status: AC
  Filled 2018-04-25: qty 50

## 2018-04-25 MED ORDER — LIDOCAINE HCL (CARDIAC) PF 100 MG/5ML IV SOSY
PREFILLED_SYRINGE | INTRAVENOUS | Status: DC | PRN
  Administered 2018-04-25: 40 mg via INTRAVENOUS

## 2018-04-25 MED ORDER — PROPOFOL 500 MG/50ML IV EMUL
INTRAVENOUS | Status: DC | PRN
  Administered 2018-04-25: 100 ug/kg/min via INTRAVENOUS

## 2018-04-25 NOTE — H&P (Signed)
Outpatient short stay form Pre-procedure 04/25/2018 10:41 AM Kelly Vaughn [name], M.D.  Primary Physician: Evern Bio, M.D.  Reason for visit:  Personal hx of colon polyps, FH colon polyps.  History of present illness:  As above. Patient denies change in bowel habits, rectal bleeding, weight loss or abdominal pain.     Current Facility-Administered Medications:  .  0.9 %  sodium chloride infusion, , Intravenous, Continuous, Joyce, Benay Pike, MD, Last Rate: 20 mL/hr at 04/25/18 1017  Medications Prior to Admission  Medication Sig Dispense Refill Last Dose  . Alpha-Lipoic Acid 300 MG CAPS Take 1 capsule by mouth daily.   Past Week at Unknown time  . ARMOUR THYROID 60 MG tablet TAKE 1 TABLET DAILY 90 tablet 0 04/24/2018 at 0600  . Ascorbic Acid (VITAMIN C) 1000 MG tablet Take 1,000 mg by mouth daily.   Past Week at Unknown time  . Biotin 5000 MCG CAPS Take 1 capsule by mouth daily.   Past Week at Unknown time  . Chelated Zinc 50 MG TABS Take 1 tablet by mouth daily.   Past Week at Unknown time  . Cholecalciferol (VITAMIN D) 2000 UNITS CAPS Take 2 capsules by mouth daily.   Past Week at Unknown time  . Coenzyme Q10 (CO Q-10) 100 MG CAPS Take 1 capsule by mouth daily.   Past Week at Unknown time  . Lactobacillus (PROBIOTIC ACIDOPHILUS PO) Take 32 mg by mouth daily.   04/24/2018 at Unknown time  . OMEGA 3 1200 MG CAPS Take 2 capsules by mouth daily.   Past Week at Unknown time  . sertraline (ZOLOFT) 100 MG tablet    04/23/2018 at Unknown time  . TURMERIC CURCUMIN PO Take 800 mg by mouth daily.   04/24/2018 at Unknown time  . celecoxib (CELEBREX) 200 MG capsule Take 200 mg by mouth as needed.   Not Taking at Unknown time  . celecoxib (CELEBREX) 200 MG capsule TAKE 1 CAPSULE once a Day   Not Taking at Unknown time  . CHELATED CALCIUM PO Take 1,200 mg by mouth daily.   Not Taking at Unknown time  . Estradiol (VAGIFEM) 10 MCG TABS Insert 1 tablet by vaginal route once daily for 14 days then 1 tablet 2  times per week for duration of use.   Not Taking at Unknown time  . simvastatin (ZOCOR) 20 MG tablet Take 20 mg by mouth daily.   Not Taking at Unknown time     No Known Allergies   Past Medical History:  Diagnosis Date  . allergic rhinitis   . Anxiety   . Basal cell carcinoma   . Depression   . Eating disorder   . GERD (gastroesophageal reflux disease)   . Headache disorder   . Heart murmur   . Hx of colonic polyps   . Hyperlipidemia   . hypothyroidism   . Hypothyroidism   . Urinary incontinence     Review of systems:  Otherwise negative.    Physical Exam  Gen: Alert, oriented. Appears stated age.  HEENT: [place]/AT. PERRLA. Lungs: CTA, no wheezes. CV: RR nl S1, S2. Abd: soft, benign, no masses. BS+ Ext: No edema. Pulses 2+    Planned procedures: Proceed with colonoscopy. The patient understands the nature of the planned procedure, indications, risks, alternatives and potential complications including but not limited to bleeding, infection, perforation, damage to internal organs and possible oversedation/side effects from anesthesia. The patient agrees and gives consent to proceed.  Please refer to procedure notes  for findings, recommendations and patient disposition/instructions.     Metta Koranda [name], M.D. Gastroenterology 04/25/2018  10:41 AM

## 2018-04-25 NOTE — Transfer of Care (Signed)
Immediate Anesthesia Transfer of Care Note  Patient: Kelly Vaughn  Procedure(s) Performed: COLONOSCOPY WITH PROPOFOL (N/A )  Patient Location: PACU  Anesthesia Type:General  Level of Consciousness: drowsy and patient cooperative  Airway & Oxygen Therapy: Patient Spontanous Breathing  Post-op Assessment: Report given to RN, Post -op Vital signs reviewed and stable and Patient moving all extremities  Post vital signs: Reviewed and stable  Last Vitals:  Vitals Value Taken Time  BP 109/64 04/25/2018 11:20 AM  Temp 36.1 C 04/25/2018 11:20 AM  Pulse 62 04/25/2018 11:23 AM  Resp 16 04/25/2018 11:23 AM  SpO2 98 % 04/25/2018 11:23 AM  Vitals shown include unvalidated device data.  Last Pain:  Vitals:   04/25/18 1120  TempSrc: Tympanic  PainSc:          Complications: No apparent anesthesia complications

## 2018-04-25 NOTE — Anesthesia Postprocedure Evaluation (Signed)
Anesthesia Post Note  Patient: SHERAN NEWSTROM  Procedure(s) Performed: COLONOSCOPY WITH PROPOFOL (N/A )  Patient location during evaluation: Endoscopy Anesthesia Type: General Level of consciousness: awake and alert, oriented and patient cooperative Pain management: satisfactory to patient Vital Signs Assessment: post-procedure vital signs reviewed and stable Respiratory status: spontaneous breathing and respiratory function stable Cardiovascular status: blood pressure returned to baseline and stable Postop Assessment: no headache, no backache, patient able to bend at knees, no apparent nausea or vomiting, adequate PO intake and able to ambulate Anesthetic complications: no     Last Vitals:  Vitals:   04/25/18 0954 04/25/18 1120  BP: 122/73 109/64  Pulse: (!) 54 62  Resp: 20 18  Temp: (!) 36.2 C (!) 36.1 C  SpO2: 100% 99%    Last Pain:  Vitals:   04/25/18 1120  TempSrc: Tympanic  PainSc:                  Clovis Riley Ashani Pumphrey

## 2018-04-25 NOTE — Anesthesia Post-op Follow-up Note (Signed)
Anesthesia QCDR form completed.        

## 2018-04-25 NOTE — Interval H&P Note (Signed)
History and Physical Interval Note:  04/25/2018 10:42 AM  Kelly Vaughn  has presented today for surgery, with the diagnosis of PH POLYPS FH POLYPS  The various methods of treatment have been discussed with the patient and family. After consideration of risks, benefits and other options for treatment, the patient has consented to  Procedure(s): COLONOSCOPY WITH PROPOFOL (N/A) as a surgical intervention .  The patient's history has been reviewed, patient examined, no change in status, stable for surgery.  I have reviewed the patient's chart and labs.  Questions were answered to the patient's satisfaction.     [place], [place]

## 2018-04-25 NOTE — Anesthesia Preprocedure Evaluation (Signed)
Anesthesia Evaluation  Patient identified by MRN, date of birth, ID band Patient awake    Reviewed: Allergy & Precautions, H&P , NPO status , Patient's Chart, lab work & pertinent test results, reviewed documented beta blocker date and time   History of Anesthesia Complications Negative for: history of anesthetic complications  Airway Mallampati: I  TM Distance: >3 FB Neck ROM: full    Dental  (+) Caps, Dental Advidsory Given, Teeth Intact   Pulmonary shortness of breath and with exertion, neg sleep apnea, neg COPD, Recent URI , Resolved,           Cardiovascular Exercise Tolerance: Good (-) hypertension(-) angina+CHF  (-) CAD, (-) Past MI, (-) Cardiac Stents and (-) CABG (-) dysrhythmias + Valvular Problems/Murmurs      Neuro/Psych PSYCHIATRIC DISORDERS Anxiety Depression Bipolar Disorder negative neurological ROS     GI/Hepatic Neg liver ROS, GERD (not currently)  ,  Endo/Other  neg diabetesHypothyroidism   Renal/GU negative Renal ROS  negative genitourinary   Musculoskeletal   Abdominal   Peds  Hematology negative hematology ROS (+)   Anesthesia Other Findings Past Medical History: No date: allergic rhinitis No date: Anxiety No date: Basal cell carcinoma No date: Depression No date: Eating disorder No date: GERD (gastroesophageal reflux disease) No date: Headache disorder No date: Heart murmur No date: Hx of colonic polyps No date: Hyperlipidemia No date: hypothyroidism No date: Hypothyroidism No date: Urinary incontinence   Reproductive/Obstetrics negative OB ROS                             Anesthesia Physical Anesthesia Plan  ASA: III  Anesthesia Plan: General   Post-op Pain Management:    Induction: Intravenous  PONV Risk Score and Plan: 3 and Propofol infusion and TIVA  Airway Management Planned: Nasal Cannula  Additional Equipment:   Intra-op Plan:    Post-operative Plan:   Informed Consent: I have reviewed the patients History and Physical, chart, labs and discussed the procedure including the risks, benefits and alternatives for the proposed anesthesia with the patient or authorized representative who has indicated his/her understanding and acceptance.   Dental Advisory Given  Plan Discussed with: Anesthesiologist, CRNA and Surgeon  Anesthesia Plan Comments:         Anesthesia Quick Evaluation

## 2018-04-25 NOTE — Op Note (Signed)
[facility] Patient Name: Kelly Vaughn Procedure Date: 04/25/2018 10:56 AM MRN: [id number] Account #: [id number] Date of Birth: 1955/10/30 Admit Type: Outpatient Age: 62 Room: [facility] ENDO ROOM 2 Gender: Female Note Status: Finalized Procedure:            Colonoscopy Indications:          Colon cancer screening in patient at increased risk:                        Family history of 1st-degree relative with colon                        polyps, High risk colon cancer surveillance: Personal                        history of colonic polyps Providers:            Benay Pike. Alice Reichert MD, MD Referring MD:         Angelina Pih Medicines:            Propofol per Anesthesia Complications:        No immediate complications. Procedure:            Pre-Anesthesia Assessment:                       - The risks and benefits of the procedure and the                        sedation options and risks were discussed with the                        patient. All questions were answered and informed                        consent was obtained.                       - Patient identification and proposed procedure were                        verified prior to the procedure by the nurse. The                        procedure was verified in the procedure room.                       - ASA Grade Assessment: III - A patient with severe                        systemic disease.                       - After reviewing the risks and benefits, the patient                        was deemed in satisfactory condition to undergo the                        procedure.  After obtaining informed consent, the colonoscope was                        passed under direct vision. Throughout the procedure,                        the patient's blood pressure, pulse, and oxygen                        saturations were monitored continuously. The                        Colonoscope  was introduced through the anus and                        advanced to the the cecum, identified by appendiceal                        orifice and ileocecal valve. The colonoscopy was                        performed without difficulty. The patient tolerated the                        procedure well. The quality of the bowel preparation                        was excellent. The ileocecal valve, appendiceal                        orifice, and rectum were photographed. Findings:      The perianal and digital rectal examinations were normal.      The colon (entire examined portion) appeared normal.      Non-bleeding internal hemorrhoids were found during retroflexion. The       hemorrhoids were Grade I (internal hemorrhoids that do not prolapse).      The exam was otherwise without abnormality. Impression:           - The entire examined colon is normal.                       - Non-bleeding internal hemorrhoids.                       - The examination was otherwise normal.                       - No specimens collected. Recommendation:       - Patient has a contact number available for                        emergencies. The signs and symptoms of potential                        delayed complications were discussed with the patient.                        Return to normal activities tomorrow. Written discharge                        instructions were provided  to the patient.                       - Resume previous diet.                       - Continue present medications.                       - Repeat colonoscopy in 5 years for screening purposes.                       - Return to GI office PRN. Procedure Code(s):    --- Professional ---                       B3435, Colorectal cancer screening; colonoscopy on                        individual at high risk Diagnosis Code(s):    --- Professional ---                       K64.0, First degree hemorrhoids                       Z86.010,  Personal history of colonic polyps                       Z83.71, Family history of colonic polyps CPT copyright 2017 [employer]. All rights reserved. The codes documented in this report are preliminary and upon coder review may  be revised to meet current compliance requirements. Efrain Sella MD, MD 04/25/2018 11:19:58 AM This report has been signed electronically. Number of Addenda: 0 Note Initiated On: 04/25/2018 10:56 AM Scope Withdrawal Time: 0 hours 6 minutes 7 seconds  Total Procedure Duration: 0 hours 14 minutes 9 seconds       [facility]

## 2018-04-26 ENCOUNTER — Encounter: Payer: Self-pay | Admitting: Internal Medicine

## 2019-02-05 ENCOUNTER — Other Ambulatory Visit: Payer: Self-pay | Admitting: Nurse Practitioner

## 2019-02-05 DIAGNOSIS — Z1231 Encounter for screening mammogram for malignant neoplasm of breast: Secondary | ICD-10-CM

## 2019-02-22 DIAGNOSIS — M159 Polyosteoarthritis, unspecified: Secondary | ICD-10-CM | POA: Insufficient documentation

## 2019-02-22 DIAGNOSIS — R5382 Chronic fatigue, unspecified: Secondary | ICD-10-CM | POA: Insufficient documentation

## 2019-02-22 DIAGNOSIS — M2550 Pain in unspecified joint: Secondary | ICD-10-CM | POA: Insufficient documentation

## 2019-03-29 ENCOUNTER — Other Ambulatory Visit: Payer: Self-pay

## 2019-03-29 ENCOUNTER — Ambulatory Visit
Admission: RE | Admit: 2019-03-29 | Discharge: 2019-03-29 | Disposition: A | Discharge status: Home / Self Care | Source: Ambulatory Visit | Attending: Nurse Practitioner | Admitting: Nurse Practitioner

## 2019-03-29 DIAGNOSIS — Z1231 Encounter for screening mammogram for malignant neoplasm of breast: Secondary | ICD-10-CM

## 2019-05-27 DIAGNOSIS — Z6828 Body mass index (BMI) 28.0-28.9, adult: Secondary | ICD-10-CM | POA: Insufficient documentation

## 2019-05-27 DIAGNOSIS — L089 Local infection of the skin and subcutaneous tissue, unspecified: Secondary | ICD-10-CM | POA: Insufficient documentation

## 2019-05-27 DIAGNOSIS — S80862A Insect bite (nonvenomous), left lower leg, initial encounter: Secondary | ICD-10-CM | POA: Insufficient documentation

## 2019-08-11 DIAGNOSIS — F32A Depression, unspecified: Secondary | ICD-10-CM | POA: Insufficient documentation

## 2019-08-11 DIAGNOSIS — F329 Major depressive disorder, single episode, unspecified: Secondary | ICD-10-CM | POA: Insufficient documentation

## 2019-08-11 DIAGNOSIS — G4730 Sleep apnea, unspecified: Secondary | ICD-10-CM | POA: Insufficient documentation

## 2019-08-24 ENCOUNTER — Ambulatory Visit (INDEPENDENT_AMBULATORY_CARE_PROVIDER_SITE_OTHER): Admitting: Podiatry

## 2019-08-24 ENCOUNTER — Encounter: Payer: Self-pay | Admitting: Podiatry

## 2019-08-24 ENCOUNTER — Other Ambulatory Visit: Payer: Self-pay

## 2019-08-24 ENCOUNTER — Ambulatory Visit (INDEPENDENT_AMBULATORY_CARE_PROVIDER_SITE_OTHER)

## 2019-08-24 ENCOUNTER — Other Ambulatory Visit: Payer: Self-pay | Admitting: Podiatry

## 2019-08-24 DIAGNOSIS — M778 Other enthesopathies, not elsewhere classified: Secondary | ICD-10-CM

## 2019-08-24 DIAGNOSIS — M19079 Primary osteoarthritis, unspecified ankle and foot: Secondary | ICD-10-CM

## 2019-08-24 DIAGNOSIS — M19071 Primary osteoarthritis, right ankle and foot: Secondary | ICD-10-CM

## 2019-08-28 NOTE — Progress Notes (Signed)
   HPI: 63 y.o. female presenting today as a new patient with a chief complaint of sharp pain to the dorsal right foot that began a few months ago. She states the pain is worse when she first stands after sitting for long periods of time. Walking for a few minutes helps ease the pain. She has been taking Tylenol Arthritis and Mobic for treatment. Patient is here for further evaluation and treatment.   Past Medical History:  Diagnosis Date  . allergic rhinitis   . Anxiety   . Basal cell carcinoma   . Depression   . Eating disorder   . GERD (gastroesophageal reflux disease)   . Headache disorder   . Heart murmur   . Hx of colonic polyps   . Hyperlipidemia   . hypothyroidism   . Hypothyroidism   . Urinary incontinence      Physical Exam: General: The patient is alert and oriented x3 in no acute distress.  Dermatology: Skin is warm, dry and supple bilateral lower extremities. Negative for open lesions or macerations.  Vascular: Palpable pedal pulses bilaterally. No edema or erythema noted. Capillary refill within normal limits.  Neurological: Epicritic and protective threshold grossly intact bilaterally.   Musculoskeletal Exam: Pain with palpation noted to the bilateral midfoot, right worse than left. Range of motion within normal limits to all pedal and ankle joints bilateral. Muscle strength 5/5 in all groups bilateral.   Radiographic Exam:  Normal osseous mineralization. Joint spaces preserved. No fracture/dislocation/boney destruction.    Assessment: 1. Bilateral midfoot arthritis, right greater than left  2. Bilateral knee DJD   Plan of Care:  1. Patient evaluated. X-Rays reviewed.  2. Continue taking Mobic as directed by ortho.  3. Continue taking OTC Tylenol.  4. Continue wearing Clarks and [employer]ken[employer] shoes.  5. Continue using OTC topical analgesics.  6. Return to clinic as needed.       Edrick Kins, DPM [facility]  Dr. Edrick Kins, DPM    [address], [place] [address]                Office [phone]  Fax [phone]

## 2019-10-10 ENCOUNTER — Encounter
Admission: RE | Admit: 2019-10-10 | Discharge: 2019-10-10 | Disposition: A | Discharge status: Home / Self Care | Source: Ambulatory Visit | Attending: Orthopedic Surgery | Admitting: Orthopedic Surgery

## 2019-10-10 DIAGNOSIS — Z01818 Encounter for other preprocedural examination: Secondary | ICD-10-CM | POA: Insufficient documentation

## 2019-10-10 HISTORY — DX: Unspecified osteoarthritis, unspecified site: M19.90

## 2019-10-10 HISTORY — DX: Family history of other specified conditions: Z84.89

## 2019-10-10 HISTORY — DX: Other complications of anesthesia, initial encounter: T88.59XA

## 2019-10-10 HISTORY — DX: Anemia, unspecified: D64.9

## 2019-10-10 NOTE — Pre-Procedure Instructions (Signed)
Progress Notes - documented in this encounter Jobe Gibbon, MD - 08/14/2019 9:00 AM EST Formatting of this note might be different from the original. Established Patient Visit   Chief Complaint: Chief Complaint  Patient presents with  . Follow-up  6 months  . Edema  bilateral foot swelling and ankles  Date of Service: 08/14/2019 Date of Birth: [birth year] PCP: Evern Bio, NP  History of Present Illness: Ms. Robert is a 64 y.o.female patient who presents with anemia GERD hyperlipidemia previous Takotsubo complains of palpitations tachycardia without any blackout spells or syncope patient has been maintained on Tenormin she now is on Repatha for lipid management and appears to be doing reasonably well still fairly active here for routine follow-up  Past Medical and Surgical History  Past Medical History Past Medical History:  Diagnosis Date  . Anemia, iron deficiency  . Bipolar affective disorder (CMS-HCC)  with Mania  . Depression  . Eating disorder  . GERD (gastroesophageal reflux disease)  . Hx of colonic polyps  . Hyperlipidemia  . Hypothyroidism (acquired), unspecified  . Left knee DJD  . Memory loss  . Menopausal syndrome  . Migraine headache  . Skin cancer  . Thyroid disease  . Urinary incontinence  . Vitamin B12 deficiency   Past Surgical History She has a past surgical history that includes Cesarean section; Cholecystectomy; Breast excisional biopsy; Tonsillectomy and adenoidectomy; Laser ablation condyloma cervical / vulvar; Colonoscopy (11/12/2010); Colonoscopy (04/25/2018); and Left knee arthroscopy, medial meniscectomy, and chondroplasty (01/03/2013).   Medications and Allergies  Current Medications  Current Outpatient Medications  Medication Sig Dispense Refill  . acetaminophen (TYLENOL) 650 MG ER tablet One tab as needed every 8 hours for joints pain, 90 days 270 tablet 0  . atenoloL (TENORMIN) 25 MG tablet Take 1 tablet (25 mg total) by  mouth once daily 90 tablet 3  . B.infantis-B.ani-B.long-B.bifi (PROBIOTIC 4X) 10-15 mg TbEC once a day  . biotin 5 mg capsule Take 5 mg by mouth once daily  . cholecalciferol (VITAMIN D3) 2,000 unit capsule Take 2,000 Units by mouth once daily  . co-enzyme Q-10, ubiquinone, (CO Q-10) 100 mg capsule Take 100 mg by mouth once daily  . estradiol (VAGIFEM) 10 mcg vaginal tablet Place 10 mcg vaginally as needed  . eszopiclone (LUNESTA) 3 mg tablet Take 3 mg by mouth nightly Take immediately before bedtime.  Marland Kitchen evening primrose oil 500 mg Cap Take by mouth once daily  . evolocumab (REPATHA SURECLICK) [id number] mg/mL PnIj Inject 140 mg subcutaneously every 14 (fourteen) days 6 mL 3  . hyaluronate (SYNVISC) 16 mg/2 mL injection Inject 2 mLs into the articular space. Injection every 6 months  . Lactobacillus acidophilus (PROBIOTIC) 10 billion cell Cap Take 1 tablet by mouth once daily  . meloxicam (MOBIC) 15 MG tablet  . multivitamin capsule Take 1 capsule by mouth once daily.  Marland Kitchen omega-3 fatty acids-fish oil (ONE-PER-DAY OMEGA-3) 684-1,200 mg CpDR Take by mouth. ( 2 tabs daily)  . progesterone (PROMETRIUM) 100 MG capsule Take 1 capsule (100 mg total) by mouth once daily 90 capsule 3  . thyroid (ARMOUR THYROID) 60 mg tablet TAKE 1 TABLET DAILY  . TURMERIC ORAL Take by mouth 15 mL liquid BID  . UNABLE TO FIND Med Name: Cbd Oil Nightly  . vilazodone (VIIBRYD) 10 mg tablet Take 10 mg by mouth once daily  . zinc 50 mg Tab Take 1 tablet by mouth once daily   No current facility-administered medications for this visit.   Allergies:  Patient has no known allergies.  Social and Family History  Social History reports that she has never smoked. She has never used smokeless tobacco. She reports current alcohol use. She reports that she does not use drugs.  Family History Family History  Problem Relation Age of Onset  . Dementia Mother  . Alcohol abuse Mother  . Arthritis Mother  . Mental illness Mother  .  Colon polyps Mother  . Osteoporosis (Thinning of bones) Mother  . Heart disease Father  . Hyperlipidemia (Elevated cholesterol) Father  . Cancer Father  . Osteoporosis (Thinning of bones) Father  . Breast cancer Sister  . Hypothyroidism Sister  . Pancreatic cancer Sister  . Hypothyroidism Sister  . Cervical cancer Sister  . Hypothyroidism Sister  . Colon polyps Paternal Grandmother  . Pancreatic cancer Sister  . Hypothyroidism Sister   Review of Systems   Review of Systems: The patient denies chest pain, shortness of breath, orthopnea, paroxysmal nocturnal dyspnea, pedal edema, palpitations, heart racing, presyncope, syncope. Review of 12 Systems is negative except as described above.  Physical Examination   Vitals:BP 116/62  Pulse 62  Ht 170.2 cm (5\' 7" )  Wt 86.6 kg (191 lb)  SpO2 97%  BMI 29.91 kg/m  Ht:170.2 cm (5\' 7" ) Wt:86.6 kg (191 lb) [id number] surface area is 2.02 meters squared. Body mass index is 29.91 kg/m.  HEENT: Pupils equally reactive to light and accomodation  Neck: Supple without thyromegaly, carotid pulses 2+ Lungs: clear to auscultation bilaterally; no wheezes, rales, rhonchi Heart: Regular rate and rhythm. No gallops, 2/6 sem murmurs or rub Abdomen: soft nontender, nondistended, with normal bowel sounds Extremities: no cyanosis, clubbing, or edema Peripheral Pulses: 2+ in all extremities, 2+ femoral pulses bilaterally  Assessment   64 y.o. female with  1. Heart palpitations  2. SOB (shortness of breath)  3. Hypertrophic cardiomyopathy (CMS-HCC)  4. Takotsubo cardiomyopathy  5. Mixed hyperlipidemia  6. Essential hypertension  7. Murmur, cardiac, unspecified   Plan  1 palpitations tachycardia no clear evidence of significant arrhythmia would consider Holter monitor if symptoms persist or worsen 2 hyperlipidemia unable to tolerate statin currently on Repatha with significant improvement in symptoms we will continue current management 3 history of  Takotsubo cardiomyopathy stress-induced in the past somewhat improved continue current therapy 4 hypertension reasonably managed continue Tenormin therapy to help with blood pressure control as well as palpitation symptoms 5 murmur chronic recurrent consider repeat echocardiogram for further evaluation in the past has been thought to be related to hypertrophic cardiomyopathy flow symptoms 6 hypertrophic cardiomyopathy reasonably mild continue Tenormin consider adding a calcium blocker patient currently asymptomatic consider Holter monitor to rule out arrhythmias 7 have the patient follow-up in 6 months  Return in about 6 months (around 02/11/2020).  DWAYNE Prince Rome, MD  This dictation was prepared with dragon dictation. Any transcription errors that result from this process are unintentional.   Electronically signed by Jobe Gibbon, MD at 08/15/2019 2:09 PM EST   Plan of Treatment - documented as of this encounter

## 2019-10-10 NOTE — Pre-Procedure Instructions (Signed)
Echo complete12/20/2019 [place] Component Name Value Ref Range  LV Ejection Fraction (%) 55   Aortic Valve Regurgitation Grade trivial   Aortic Valve Stenosis Grade none   Aortic Valve Max Velocity (m/s) 1.7 m/sec  Aortic Valve Stenosis Mean Gradient (mmHg) 7.0 mmHg  Mitral Valve Regurgitation Grade mild   Mitral Valve Stenosis Grade none   Tricuspid Valve Regurgitation Grade trivial   Tricuspid Valve Regurgitation Max Velocity (m/s) 2.1 m/sec  Right Ventricle Systolic Pressure (mmHg) 123XX123 mmHg  LV End Diastolic Diameter (cm) 4.1 cm  LV End Systolic Diameter (cm) 3 cm  LV Septum Wall Thickness (cm) 1.6 cm  LV Posterior Wall Thickness (cm) 1 cm  Left Atrium Diameter (cm) 3.6 cm  Result Narrative             CARDIOLOGY DEPARTMENT          [facility]                  [id number]      [place] #: [id number]      [address], [place], [place] [address]    Date: 09/06/2018 02: 06 PM                                Adult  Female  Age: 64 yrs      ECHOCARDIOGRAM REPORT               Outpatient                                KC^^[facility]    STUDY:CHEST WALL        TAPE:0000: 00: 0: 00: 00 MD1: CALLWOOD, DWAYNE DENNIS    ECHO:Yes  DOPPLER:Yes    FILE:[id number]    COLOR:Yes  CONTRAST:No   MACHINE:Philips  RV BIOPSY:No     3D:No SOUND QLTY:Moderate      Height: 67 in   MEDIUM:None                       Weight: 180 lb                                BSA: 1.9 m2 _________________________________________________________________________________________        HISTORY: Cardiomyopathy         REASON: Assess, LV function       INDICATION: 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, 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 IVC: nm*    [<=2.1]           FS: 27.3 %    [>25]      Min. IVC: nm*          SWT: 1.6 cm    [0.5-0.9]  ------------------          PWT: 1.0 cm    [0.5-0.9]  nm* - not measured LEFT ATRIUM        LA Diam: 3.6 cm    [2.7-3.8]      LA A4C Area: nm*     [<20]       LA Volume: nm*     [22-52] _________________________________________________________________________________________ ECHOCARDIOGRAPHIC DESCRIPTIONS AORTIC ROOT          Size: Normal       Dissection: INDETERM FOR DISSECTION AORTIC VALVE        Leaflets: Tricuspid          Morphology: Normal        Mobility: Fully mobile LEFT VENTRICLE          Size: Normal            Anterior: Normal      Contraction: Normal             Lateral: Normal       Closest EF: >55% (Estimated)        Septal: Normal       LV Masses: No Masses            Apical: Normal          LVH: MODERATE LVH CONCENTRIC    Inferior: Normal                            Posterior: Normal      Dias.FxClass: (Grade 1) relaxation abnormal, E/A reversal MITRAL VALVE        Leaflets: Normal            Mobility: Fully mobile       Morphology: Normal LEFT ATRIUM          Size: Normal            LA Masses: No masses       IA Septum: Normal IAS MAIN PA          Size: Normal PULMONIC VALVE       Morphology: Normal            Mobility: Fully mobile RIGHT VENTRICLE       RV Masses: No Masses             Size: Normal       Free Wall: Normal           Contraction: Normal TRICUSPID VALVE        Leaflets: Normal            Mobility: Fully mobile       Morphology: Normal RIGHT ATRIUM          Size: Normal  RA Other: None        RA Mass: No masses PERICARDIUM         Fluid: No effusion INFERIOR VENACAVA          Size: Normal Normal respiratory collapse _________________________________________________________________________________________  DOPPLER ECHO and OTHER SPECIAL PROCEDURES         Aortic: TRIVIAL AR         No AS             172.6 cm/sec peak vel   11.9 mmHg peak grad             7.0 mmHg mean grad     3.0 cm^2 by DOPPLER         Mitral: MILD MR          No MS             MV Inflow E Vel = 77.1 cm/sec   MV Annulus E'Vel = 5.0 cm/sec             E/E'Ratio = 15.4       Tricuspid: TRIVIAL TR         No TS             206.8 cm/sec peak TR vel  22.1 mmHg peak RV pressure       Pulmonary: TRIVIAL PR         No PS _________________________________________________________________________________________ INTERPRETATION NORMAL LEFT VENTRICULAR SYSTOLIC FUNCTION  WITH MODERATE LVH NORMAL RIGHT VENTRICULAR SYSTOLIC FUNCTION MILD  VALVULAR REGURGITATION (See above) NO VALVULAR STENOSIS ELEVATED VELOCITIES (2.5 m/sec) THROUGH THE LVOT NOTED AT REST (MAX PG 25 mmHg) EF=55-60% _________________________________________________________________________________________ Electronically signed by  Lujean Amel, MD on 09/08/2018 10: 04 AM      Performed By: Johnathan Hausen   Ordering Physician: Lujean Amel _________________________________________________________________________________________  Other Result Information  Interface, Text Results In - 09/08/2018 10:04 AM EST                      CARDIOLOGY DEPARTMENT                   ANASTYN, HOWISON           [place]                                    [id number]           [place] #: [id number]           [place], [place], [place] [address]       Date: 09/06/2018 02: 06 PM                                                              Adult   Female   Age: 64 yrs           ECHOCARDIOGRAM REPORT                              Outpatient  KC^^KCWC      STUDY:CHEST WALL               TAPE:0000: 00: 0: 00: 00 MD1: CALLWOOD, DWAYNE DENNIS       ECHO:Yes    DOPPLER:Yes       FILE:[id number]      COLOR:Yes   CONTRAST:No     MACHINE:Philips  RV BIOPSY:No          3D:No  SOUND QLTY:Moderate            Height: 67 in     MEDIUM:None                                              Weight: 180 lb                                                              BSA: 1.9 m2 _________________________________________________________________________________________               HISTORY: Cardiomyopathy                REASON: Assess, LV function            INDICATION: 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, I20.8 Other forms of                        angina  pectoris _________________________________________________________________________________________ ECHOCARDIOGRAPHIC MEASUREMENTS 2D DIMENSIONS AORTA                  Values   Normal Range   MAIN PA         Values    Normal Range               Annulus: nm*          [2.1-2.5]         PA Main: nm*       [1.5-2.1]             Aorta Sin: 3.3 cm       [2.7-3.3]    RIGHT VENTRICLE           ST Junction: nm*          [2.3-2.9]         RV Base: 3.9 cm    [<4.2]             Asc.Aorta: nm*          [2.3-3.1]          RV Mid: nm*       [<3.5] LEFT VENTRICLE                                      RV Length: nm*       [<8.6]                 LVIDd: 4.1 cm       [3.9-5.3]    INFERIOR VENA CAVA                 LVIDs: 3.0 cm  Max. IVC: nm*       [<=2.1]                    FS: 27.3 %       [>25]            Min. IVC: nm*                   SWT: 1.6 cm       [0.5-0.9]    ------------------                   PWT: 1.0 cm       [0.5-0.9]    nm* - not measured LEFT ATRIUM               LA Diam: 3.6 cm       [2.7-3.8]           LA A4C Area: nm*          [<20]             LA Volume: nm*          [22-52] _________________________________________________________________________________________ ECHOCARDIOGRAPHIC DESCRIPTIONS AORTIC ROOT                  Size: Normal            Dissection: INDETERM FOR DISSECTION AORTIC VALVE              Leaflets: Tricuspid                   Morphology: Normal              Mobility: Fully mobile LEFT VENTRICLE                  Size: Normal                        Anterior: Normal           Contraction: Normal                         Lateral: Normal            Closest EF: >55% (Estimated)                Septal: Normal             LV Masses: No Masses                       Apical: Normal                   LVH: MODERATE LVH CONCENTRIC       Inferior: Normal                                                     Posterior: Normal          Dias.FxClass: (Grade 1)  relaxation abnormal, E/A reversal MITRAL VALVE              Leaflets: Normal                        Mobility: Fully mobile            Morphology:  Normal LEFT ATRIUM                  Size: Normal                       LA Masses: No masses             IA Septum: Normal IAS MAIN PA                  Size: Normal PULMONIC VALVE            Morphology: Normal                        Mobility: Fully mobile RIGHT VENTRICLE             RV Masses: No Masses                         Size: Normal             Free Wall: Normal                     Contraction: Normal TRICUSPID VALVE              Leaflets: Normal                        Mobility: Fully mobile            Morphology: Normal RIGHT ATRIUM                  Size: Normal                        RA Other: None               RA Mass: No masses PERICARDIUM                 Fluid: No effusion INFERIOR VENACAVA                  Size: Normal Normal respiratory collapse _________________________________________________________________________________________  DOPPLER ECHO and OTHER SPECIAL PROCEDURES                Aortic: TRIVIAL AR                 No AS                        172.6 cm/sec peak vel      11.9 mmHg peak grad                        7.0 mmHg mean grad         3.0 cm^2 by DOPPLER                Mitral: MILD MR                    No MS                        MV Inflow E Vel = 77.1 cm/sec     MV Annulus E'Vel = 5.0 cm/sec                        E/E'Ratio = 15.4  Tricuspid: TRIVIAL TR                 No TS                        206.8 cm/sec peak TR vel   22.1 mmHg peak RV pressure             Pulmonary: TRIVIAL PR                 No PS _________________________________________________________________________________________ INTERPRETATION NORMAL LEFT VENTRICULAR SYSTOLIC FUNCTION   WITH MODERATE LVH NORMAL RIGHT VENTRICULAR SYSTOLIC FUNCTION MILD VALVULAR REGURGITATION (See above) NO VALVULAR STENOSIS ELEVATED VELOCITIES  (2.5 m/sec) THROUGH THE LVOT NOTED AT REST (MAX PG 25 mmHg) EF=55-60% _________________________________________________________________________________________ Electronically signed by    Lujean Amel, MD on 09/08/2018 10: 04 AM          Performed By: Johnathan Hausen    Ordering Physician: Lujean Amel _________________________________________________________________________________________  Status Results Details   Encounter Summary

## 2019-10-10 NOTE — Pre-Procedure Instructions (Signed)
Progress Notes - documented in this encounter Jobe Gibbon, MD - 09/26/2019 4:00 PM EST 72-hour Holter Date of hookup was August 14, 2019 Indication palpitation Patient wore the Holter for about 3 days  Total beats 268,521 Minimum rate 47 maximum 111 average 71 Mostly sinus rhythm with interventricular conduction delay No significant PACs No significant PVCs No runs No pauses No high-grade blocks No ST segment changes No evidence of atrial fibrillation No diary submitted  Conclusion Interventricular conduction delay mostly sinus rhythm Otherwise benign Holter  Electronically signed by Jobe Gibbon, MD at 09/26/2019 4:27 PM EST

## 2019-10-10 NOTE — Patient Instructions (Addendum)
Your procedure is scheduled on: 10-15-19 MONDAY Report to Same Day Surgery 2nd floor medical mall [facility] Entrance-take elevator on left to 2nd floor.  Check in with surgery information desk.) To find out your arrival time please call [phone] between 1PM - 3PM on 10-12-19 FRIDAY  Remember: Instructions that are not followed completely may result in serious medical risk, up to and including death, or upon the discretion of your surgeon and anesthesiologist your surgery may need to be rescheduled.    _x___ 1. Do not eat food after midnight the night before your procedure. NO GUM OR CANDY AFTER MIDNIGHT. You may drink clear liquids up to 2 hours before you are scheduled to arrive at the hospital for your procedure.  Do not drink clear liquids within 2 hours of your scheduled arrival to the hospital.  Clear liquids include  --Water or Apple juice without pulp  --Gatorade  --Black Coffee or Clear Tea (No milk, no creamers, do not add anything to the coffee or Tea   ____Ensure clear carbohydrate drink on the way to the hospital for bariatric patients  _X___Ensure clear carbohydrate drink 3 hours PRIOR TO ARRIVAL TIME TO HOSPITAL-IF YOUR ARRIVAL TIME IS 6 AM, HAVE DRINK FINISHED BY 4:30 AM. OTHERWISE, HAVE DRINK FINISHED 3 HOURS PRIOR TO ARRIVAL TIME TO HOSPITAL    __x__ 2. No Alcohol for 24 hours before or after surgery.   __x__3. No Smoking or e-cigarettes for 24 prior to surgery.  Do not use any chewable tobacco products for at least 6 hour prior to surgery   ____  4. Bring all medications with you on the day of surgery if instructed.    __x__ 5. Notify your doctor if there is any change in your medical condition     (cold, fever, infections).    x___6. On the morning of surgery brush your teeth with toothpaste and water.  You may rinse your mouth with mouth wash if you wish.  Do not swallow any toothpaste or mouthwash.   Do not wear jewelry, make-up, hairpins, clips or nail  polish.  Do not wear lotions, powders, or perfumes.  Do not shave 48 hours prior to surgery. Men may shave face and neck.  Do not bring valuables to the hospital.    [facility] is not responsible for any belongings or valuables.               Contacts, dentures or bridgework may not be worn into surgery.  Leave your suitcase in the car. After surgery it may be brought to your room.  For patients admitted to the hospital, discharge time is determined by your treatment team.  _  Patients discharged the day of surgery will not be allowed to drive home.  You will need someone to drive you home and stay with you the night of your procedure.    Please read over the following fact sheets that you were given:   [facility] Preparing for Surgery and or MRSA Information/INCENTIVE SPIROMETER   _x___ TAKE THE FOLLOWING MEDICATION THE MORNING OF SURGERY WITH A SMALL SIP OF WATER. These include:  1. ARMOUR THYROID  2.  3.  4.  5.  6.  ____Fleets enema or Magnesium Citrate as directed.   _x___ Use CHG Soap or sage wipes as directed on instruction sheet   ____ Use inhalers on the day of surgery and bring to hospital day of surgery  ____ Stop Metformin and Janumet 2 days prior to  surgery.    ____ Take 1/2 of usual insulin dose the night before surgery and none on the morning surgery.   ____ Follow recommendations from Cardiologist, Pulmonologist or PCP regarding stopping Aspirin, Coumadin, Plavix ,Eliquis, Effient, or Pradaxa, and Pletal.  X____Stop Anti-inflammatories such as Advil, Aleve, Ibuprofen, Motrin, Naproxen,MELOXICAM (MOBIC) Naprosyn, Goodies powders or aspirin products NOW-OK to take Tylenol    _x___ Stop supplements until after surgery-STOP OMEGA 3, CO Q-10, MELATONIN, AND ALPHA LIPOIC ACID NOW-MAY RESUME AFTER SURGERY   ____ Bring C-Pap to the hospital.

## 2019-10-11 ENCOUNTER — Other Ambulatory Visit

## 2019-10-11 ENCOUNTER — Other Ambulatory Visit: Payer: Self-pay

## 2019-10-11 ENCOUNTER — Encounter
Admission: RE | Admit: 2019-10-11 | Discharge: 2019-10-11 | Disposition: A | Discharge status: Home / Self Care | Source: Ambulatory Visit | Attending: Orthopedic Surgery | Admitting: Orthopedic Surgery

## 2019-10-11 DIAGNOSIS — Z01812 Encounter for preprocedural laboratory examination: Secondary | ICD-10-CM | POA: Insufficient documentation

## 2019-10-11 DIAGNOSIS — Z01810 Encounter for preprocedural cardiovascular examination: Secondary | ICD-10-CM

## 2019-10-11 DIAGNOSIS — Z01818 Encounter for other preprocedural examination: Secondary | ICD-10-CM | POA: Diagnosis not present

## 2019-10-11 DIAGNOSIS — Z20822 Contact with and (suspected) exposure to covid-19: Secondary | ICD-10-CM | POA: Insufficient documentation

## 2019-10-11 LAB — PROTIME-INR
INR: 1 (ref 0.8–1.2)
Prothrombin Time: 13.5 seconds (ref 11.4–15.2)

## 2019-10-11 LAB — SARS CORONAVIRUS 2 (TAT 6-24 HRS): SARS Coronavirus 2: NEGATIVE

## 2019-10-11 LAB — COMPREHENSIVE METABOLIC PANEL
ALT: 23 U/L (ref 0–44)
AST: 25 U/L (ref 15–41)
Albumin: 4.3 g/dL (ref 3.5–5.0)
Alkaline Phosphatase: 38 U/L (ref 38–126)
Anion gap: 8 (ref 5–15)
BUN: 25 mg/dL — ABNORMAL HIGH (ref 8–23)
CO2: 25 mmol/L (ref 22–32)
Calcium: 9.3 mg/dL (ref 8.9–10.3)
Chloride: 102 mmol/L (ref 98–111)
Creatinine, Ser: 0.65 mg/dL (ref 0.44–1.00)
GFR calc Af Amer: >60 mL/min (ref >60)
GFR calc non Af Amer: >60 mL/min (ref >60)
Glucose, Bld: 103 mg/dL — ABNORMAL HIGH (ref 70–99)
Potassium: 4 mmol/L (ref 3.5–5.1)
Sodium: 135 mmol/L (ref 135–145)
Total Bilirubin: 0.8 mg/dL (ref 0.3–1.2)
Total Protein: 7.2 g/dL (ref 6.5–8.1)

## 2019-10-11 LAB — URINALYSIS, ROUTINE W REFLEX MICROSCOPIC
Bilirubin Urine: NEGATIVE
Glucose, UA: NEGATIVE mg/dL
Hgb urine dipstick: NEGATIVE
Ketones, ur: NEGATIVE mg/dL
Leukocytes,Ua: NEGATIVE
Nitrite: NEGATIVE
Protein, ur: NEGATIVE mg/dL
Specific Gravity, Urine: 1.004 — ABNORMAL LOW (ref 1.005–1.030)
pH: 6 (ref 5.0–8.0)

## 2019-10-11 LAB — SURGICAL PCR SCREEN
MRSA, PCR: NEGATIVE
Staphylococcus aureus: NEGATIVE

## 2019-10-11 LAB — CBC
HCT: 43.1 % (ref 36.0–46.0)
Hemoglobin: 13.7 g/dL (ref 12.0–15.0)
MCH: 30.6 pg (ref 26.0–34.0)
MCHC: 31.8 g/dL (ref 30.0–36.0)
MCV: 96.4 fL (ref 80.0–100.0)
Platelets: 163 10*3/uL (ref 150–400)
RBC: 4.47 MIL/uL (ref 3.87–5.11)
RDW: 13.2 % (ref 11.5–15.5)
WBC: 4.7 10*3/uL (ref 4.0–10.5)
nRBC: 0 % (ref 0.0–0.2)

## 2019-10-11 LAB — TYPE AND SCREEN
ABO/RH(D): A POS
Antibody Screen: NEGATIVE

## 2019-10-11 LAB — SEDIMENTATION RATE: Sed Rate: 5 mm/hr (ref 0–30)

## 2019-10-11 LAB — C-REACTIVE PROTEIN: CRP: 1.6 mg/dL — ABNORMAL HIGH (ref <1.0)

## 2019-10-11 LAB — APTT: aPTT: 28 seconds (ref 24–36)

## 2019-10-11 NOTE — Pre-Procedure Instructions (Signed)
Abnormal crp result faxed to Dr Clydell Hakim office

## 2019-10-11 NOTE — Pre-Procedure Instructions (Signed)
Dr. Andree Elk reviewed EKG done today and Ok'd to proceed with no further testing.

## 2019-10-12 LAB — URINE CULTURE
Culture: NO GROWTH
Special Requests: NORMAL

## 2019-10-14 ENCOUNTER — Encounter: Payer: Self-pay | Admitting: Orthopedic Surgery

## 2019-10-14 MED ORDER — TRANEXAMIC ACID-NACL 1000-0.7 MG/100ML-% IV SOLN
1000.0000 mg | INTRAVENOUS | Status: AC
  Administered 2019-10-15: 1000 mg via INTRAVENOUS

## 2019-10-14 NOTE — H&P (Signed)
ORTHOPAEDIC HISTORY & PHYSICAL  Progress Notes by [name], PA at 10/11/2019 9:15 AM  [facility] Chief Complaint:       Chief Complaint  Patient presents with  . Knee Pain    H & P LEFT KNEE    History of Present Illness:    Kelly Vaughn is a 64 y.o. female that presents to clinic today for her preoperative history and evaluation.  Patient presents unaccompanied. The patient is scheduled to undergo a left total knee arthroplasty on 10/15/19 by Dr. Marry Guan. Her pain began approximately 5 years ago.  The pain is located along the medial and anterior aspects of the knee.She reports associated increase in pain with stairs, rising after sitting, standing, and walking.  She denies associated numbness or tingling.   The patient's symptoms have progressed to the point that they decrease her quality of life. The patient has previously undergone conservative treatment including NSAIDS and injections to the knee without adequate control of her symptoms.  Patient states she does have a heart murmur but that it has been worked up by cardiology in the past and she takes atenolol for it. She denies any other cardiac history. Denies any history of blood clots.  Past Medical, Surgical, Family, Social History, Allergies, Medications:   Past Medical History:      Past Medical History:  Diagnosis Date  . Anemia, iron deficiency   . Bipolar affective disorder (CMS-HCC)    with Mania  . Depression   . Eating disorder   . GERD (gastroesophageal reflux disease)   . Hx of colonic polyps   . Hyperlipidemia   . Hypothyroidism (acquired), unspecified   . Left knee DJD   . Memory loss   . Menopausal syndrome   . Migraine headache   . Skin cancer   . Thyroid disease   . Urinary incontinence   . Vitamin B12 deficiency     Past Surgical History:       Past Surgical History:  Procedure Laterality Date  . BREAST  EXCISIONAL BIOPSY    . CESAREAN SECTION    . CHOLECYSTECTOMY    . COLONOSCOPY  11/12/2010   @ [facility] - Nml per pt. PHPolyps 2008, FHPolyps, rec. req.  Marland Kitchen COLONOSCOPY  04/25/2018   Entire examined colon is normal/FHx CP/PHx CP/Repeat 29yrs/TKT  . LASER ABLATION CONDYLOMA CERVICAL / VULVAR    . Left knee arthroscopy, medial meniscectomy, and chondroplasty  01/03/2013   Dr. Marry Guan  . TONSILLECTOMY AND ADENOIDECTOMY      Current Medications:  Current Medications        Current Outpatient Medications  Medication Sig Dispense Refill  . acetaminophen (TYLENOL) 650 MG ER tablet One tab as needed every 8 hours for joints pain, 90 days 270 tablet 0  . atenoloL (TENORMIN) 25 MG tablet Take 1 tablet (25 mg total) by mouth once daily 90 tablet 3  . B.infantis-B.ani-B.long-B.bifi (PROBIOTIC 4X) 10-15 mg TbEC once a day    . biotin 5 mg capsule Take 5 mg by mouth once daily      . cholecalciferol (VITAMIN D3) 2,000 unit capsule Take 2,000 Units by mouth once daily      . co-enzyme Q-10, ubiquinone, (CO Q-10) 100 mg capsule Take 100 mg by mouth once daily      . eszopiclone (LUNESTA) 3 mg tablet Take 3 mg by mouth nightly Take immediately before bedtime.      Marland Kitchen evolocumab (REPATHA SURECLICK) [id number]  mg/mL PnIj Inject 140 mg subcutaneously every 14 (fourteen) days 6 mL 3  . hyaluronate (SYNVISC) 16 mg/2 mL injection Inject 2 mLs into the articular space. Injection every 6 months    . Lactobacillus acidophilus (PROBIOTIC) 10 billion cell Cap Take 1 tablet by mouth once daily    . melatonin-pyridoxine, vit B6, (MELATONIN, WITH B6,) 5-1 mg Tab Take by mouth    . meloxicam (MOBIC) 15 MG tablet     . omega-3 fatty acids-fish oil (ONE-PER-DAY OMEGA-3) 684-1,200 mg CpDR Take by mouth. ( 2 tabs daily)     . progesterone (PROMETRIUM) 100 MG capsule Take 1 capsule (100 mg total) by mouth once daily 90 capsule 3  . thyroid (ARMOUR THYROID) 60 mg tablet TAKE 1 TABLET DAILY    .  TURMERIC ORAL Take 2,000 mg by mouth once daily 15 mL liquid BID       . zinc 50 mg Tab Take 1 tablet by mouth once daily     No current facility-administered medications for this visit.       Allergies: No Known Allergies  Social History:  Social History  Social History        Socioeconomic History  . Marital status: Married    Spouse name: Not on file  . Number of children: Not on file  . Years of education: Not on file  . Highest education level: Not on file  Occupational History  . Not on file  Social Needs  . Financial resource strain: Not on file  . Food insecurity    Worry: Not on file    Inability: Not on file  . Transportation needs    Medical: Not on file    Non-medical: Not on file  Tobacco Use  . Smoking status: Never Smoker  . Smokeless tobacco: Never Used  Substance and Sexual Activity  . Alcohol use: Yes    Comment: social drinker  . Drug use: No  . Sexual activity: Yes    Partners: Male    Birth control/protection: Post-menopausal  Lifestyle  . Physical activity    Days per week: Not on file    Minutes per session: Not on file  . Stress: Not on file  Relationships  . Social Herbalist on phone: Not on file    Gets together: Not on file    Attends religious service: Not on file    Active member of club or organization: Not on file    Attends meetings of clubs or organizations: Not on file    Relationship status: Not on file  Other Topics Concern  . Not on file  Social History Narrative  . Not on file      Family History:       Family History  Problem Relation Age of Onset  . Dementia Mother   . Alcohol abuse Mother   . Arthritis Mother   . Mental illness Mother   . Colon polyps Mother   . Osteoporosis (Thinning of bones) Mother   . Heart disease Father   . Hyperlipidemia (Elevated cholesterol) Father   . Cancer Father   . Osteoporosis (Thinning of bones) Father   .  Breast cancer Sister   . Hypothyroidism Sister   . Pancreatic cancer Sister   . Hypothyroidism Sister   . Cervical cancer Sister   . Hypothyroidism Sister   . Colon polyps Paternal Grandmother   . Pancreatic cancer Sister   . Hypothyroidism Sister  Review of Systems:   A 10+ ROS was performed, reviewed, and the pertinent orthopaedic findings are documented in the HPI.    Physical Examination:   BP 120/66   Ht 170.2 cm (5\' 7" )   Wt 86.5 kg (190 lb 9.6 oz)   BMI 29.85 kg/m   Patient is a well-developed, well-nourished female in no acute distress. Patient has normal mood and affect. Patient is alert and oriented to person, place, and time.   HEENT: Atraumatic, normocephalic.  Pupils equal and reactive to light.  Extraocular motion intact.  Noninjected sclera.  Cardiovascular: Regular rate and rhythm, with no , rubs, or gallops. 2/6 Systolic ejection murmur noted. Distal pulses palpable.  Respiratory: Lungs clear to auscultation bilaterally.   Left Knee: Soft tissue swelling:minimal Effusion:none Erythema:none Crepitance:mild Tenderness:medial Alignment:relative varus Mediolateral laxity:medial pseudolaxity Posterior [id number] Patellar tracking:Good tracking without evidence of subluxation or tilt Atrophy:No significantatrophy.  Quadriceps tone was fair to good. Range of motion:0/5/116degrees   Sensation intact over the saphenous, lateral sural cutaneous, superficial fibular, and deep fibular nerve distributions.  Tests Performed/Reviewed:  X-rays  No new radiographs were obtained today. Previous radiographs from 08/09/2019 were reviewed of the left knee and revealed pleat loss of medial joint space with bone-on-bone contact, osteophyte  formation, and subchondral sclerosis of the bone noted. Lateral compartment remains relatively well-preserved minimal osteophyte formation. Patellofemoral joint reveals moderate loss of joint space with osteophyte formation. No fractures noted.   Impression:     ICD-10-CM  1. Primary osteoarthritis of left knee  M17.12   Plan:   The patient has end-stage degenerative changes of the left knee.  It was explained to the patient that the condition is progressive in nature.  Having failed conservative treatment, the patient has elected to proceed with a total joint arthroplasty.  The patient will undergo a total joint arthroplasty with Dr. Marry Guan.  The risks of surgery, including blood clot and infection, were discussed with the patient.  Measures to reduce these risks, including the use of anticoagulation, perioperative antibiotics, and early ambulation were discussed.  The importance of postoperative physical therapy was discussed with the patient. The patient elects to proceed with surgery. The patient is instructed to stop all blood thinners prior to surgery.  The patient is instructed to call the hospital the day before surgery to learn of the proper arrival time.    Contact our office with any questions or concerns.  Follow up as indicated, or sooner should any new problems arise, if conditions worsen, or if they are otherwise concerned.   [name], PA [facility] [address] [place], [place] [address] Phone: [phone]  This note was generated in part with voice recognition software and I apologize for any typographical errors that were not detected and corrected.     Electronically signed by [name], [place] on 10/11/2019 12:08 PM

## 2019-10-15 ENCOUNTER — Encounter: Payer: Self-pay | Admitting: Orthopedic Surgery

## 2019-10-15 ENCOUNTER — Ambulatory Visit
Admission: RE | Admit: 2019-10-15 | Discharge: 2019-10-15 | Disposition: A | Discharge status: Home / Self Care | Attending: Orthopedic Surgery | Admitting: Orthopedic Surgery

## 2019-10-15 ENCOUNTER — Encounter
Admission: RE | Disposition: A | Discharge status: Home / Self Care | Payer: Self-pay | Source: Home / Self Care | Attending: Orthopedic Surgery

## 2019-10-15 ENCOUNTER — Ambulatory Visit: Admitting: Anesthesiology

## 2019-10-15 ENCOUNTER — Other Ambulatory Visit: Payer: Self-pay

## 2019-10-15 ENCOUNTER — Ambulatory Visit

## 2019-10-15 DIAGNOSIS — Z85828 Personal history of other malignant neoplasm of skin: Secondary | ICD-10-CM | POA: Insufficient documentation

## 2019-10-15 DIAGNOSIS — Z79899 Other long term (current) drug therapy: Secondary | ICD-10-CM | POA: Diagnosis not present

## 2019-10-15 DIAGNOSIS — Z96659 Presence of unspecified artificial knee joint: Secondary | ICD-10-CM

## 2019-10-15 DIAGNOSIS — E039 Hypothyroidism, unspecified: Secondary | ICD-10-CM | POA: Insufficient documentation

## 2019-10-15 DIAGNOSIS — M1712 Unilateral primary osteoarthritis, left knee: Secondary | ICD-10-CM | POA: Insufficient documentation

## 2019-10-15 DIAGNOSIS — Z791 Long term (current) use of non-steroidal anti-inflammatories (NSAID): Secondary | ICD-10-CM | POA: Diagnosis not present

## 2019-10-15 DIAGNOSIS — Z79890 Hormone replacement therapy (postmenopausal): Secondary | ICD-10-CM | POA: Insufficient documentation

## 2019-10-15 DIAGNOSIS — G4730 Sleep apnea, unspecified: Secondary | ICD-10-CM | POA: Diagnosis not present

## 2019-10-15 DIAGNOSIS — Z96652 Presence of left artificial knee joint: Secondary | ICD-10-CM

## 2019-10-15 DIAGNOSIS — E785 Hyperlipidemia, unspecified: Secondary | ICD-10-CM | POA: Insufficient documentation

## 2019-10-15 HISTORY — PX: KNEE ARTHROPLASTY: SHX992

## 2019-10-15 LAB — ABO/RH: ABO/RH(D): A POS

## 2019-10-15 SURGERY — ARTHROPLASTY, KNEE, TOTAL, USING IMAGELESS COMPUTER-ASSISTED NAVIGATION
Anesthesia: Spinal | Site: Knee | Laterality: Left

## 2019-10-15 MED ORDER — CEFAZOLIN SODIUM-DEXTROSE 2-4 GM/100ML-% IV SOLN
INTRAVENOUS | Status: AC
  Filled 2019-10-15: qty 100

## 2019-10-15 MED ORDER — LACTATED RINGERS IV BOLUS
500.0000 mL | Freq: Once | INTRAVENOUS | Status: AC
  Administered 2019-10-15: 500 mL via INTRAVENOUS

## 2019-10-15 MED ORDER — CELECOXIB 200 MG PO CAPS: 400.0000 mg | ORAL_CAPSULE | Freq: Once | ORAL | Status: AC

## 2019-10-15 MED ORDER — ONDANSETRON HCL 4 MG/2ML IJ SOLN: 4.0000 mg | Freq: Once | INTRAMUSCULAR | Status: DC | PRN

## 2019-10-15 MED ORDER — ACETAMINOPHEN 325 MG PO TABS: 325.0000 mg | ORAL_TABLET | Freq: Four times a day (QID) | ORAL | Status: DC | PRN

## 2019-10-15 MED ORDER — ENSURE PRE-SURGERY PO LIQD
296.0000 mL | Freq: Once | ORAL | Status: DC
  Filled 2019-10-15: qty 296

## 2019-10-15 MED ORDER — BUPIVACAINE HCL (PF) 0.5 % IJ SOLN
INTRAMUSCULAR | Status: AC
  Filled 2019-10-15: qty 10

## 2019-10-15 MED ORDER — TRAMADOL HCL 50 MG PO TABS: 50.0000 mg | ORAL_TABLET | ORAL | Status: DC | PRN

## 2019-10-15 MED ORDER — SODIUM CHLORIDE FLUSH 0.9 % IV SOLN
INTRAVENOUS | Status: AC
  Filled 2019-10-15: qty 40

## 2019-10-15 MED ORDER — PROPOFOL 500 MG/50ML IV EMUL
INTRAVENOUS | Status: DC | PRN
  Administered 2019-10-15: 75 ug/kg/min via INTRAVENOUS

## 2019-10-15 MED ORDER — EPHEDRINE SULFATE 50 MG/ML IJ SOLN
INTRAMUSCULAR | Status: DC | PRN
  Administered 2019-10-15: 5 mg via INTRAVENOUS
  Administered 2019-10-15: 10 mg via INTRAVENOUS
  Administered 2019-10-15: 5 mg via INTRAVENOUS
  Administered 2019-10-15: 10 mg via INTRAVENOUS
  Administered 2019-10-15: 5 mg via INTRAVENOUS

## 2019-10-15 MED ORDER — BUPIVACAINE LIPOSOME 1.3 % IJ SUSP
INTRAMUSCULAR | Status: AC
  Filled 2019-10-15: qty 20

## 2019-10-15 MED ORDER — TRAMADOL HCL 50 MG PO TABS: 50.0000 mg | ORAL_TABLET | Freq: Four times a day (QID) | ORAL | 0 refills | Status: DC | PRN

## 2019-10-15 MED ORDER — ACETAMINOPHEN 10 MG/ML IV SOLN
INTRAVENOUS | Status: AC
  Filled 2019-10-15: qty 100

## 2019-10-15 MED ORDER — SEVOFLURANE IN SOLN
RESPIRATORY_TRACT | Status: AC
  Filled 2019-10-15: qty 250

## 2019-10-15 MED ORDER — ONDANSETRON HCL 4 MG PO TABS: 4.0000 mg | ORAL_TABLET | Freq: Four times a day (QID) | ORAL | Status: DC | PRN

## 2019-10-15 MED ORDER — SODIUM CHLORIDE 0.9 % IV SOLN
INTRAVENOUS | Status: DC | PRN
  Administered 2019-10-15: 60 mL

## 2019-10-15 MED ORDER — DIPHENHYDRAMINE HCL 12.5 MG/5ML PO ELIX
12.5000 mg | ORAL_SOLUTION | ORAL | Status: DC | PRN
  Filled 2019-10-15: qty 10

## 2019-10-15 MED ORDER — MAGNESIUM HYDROXIDE 400 MG/5ML PO SUSP
30.0000 mL | Freq: Every day | ORAL | Status: DC
  Administered 2019-10-15: 16:00:00 30 mL via ORAL
  Filled 2019-10-15: qty 30

## 2019-10-15 MED ORDER — METOCLOPRAMIDE HCL 10 MG PO TABS: 5.0000 mg | ORAL_TABLET | Freq: Three times a day (TID) | ORAL | Status: DC | PRN

## 2019-10-15 MED ORDER — NEOMYCIN-POLYMYXIN B GU 40-200000 IR SOLN
Status: DC | PRN
  Administered 2019-10-15: 2 mL

## 2019-10-15 MED ORDER — PROPOFOL 500 MG/50ML IV EMUL
INTRAVENOUS | Status: AC
  Filled 2019-10-15: qty 50

## 2019-10-15 MED ORDER — DEXAMETHASONE SODIUM PHOSPHATE 10 MG/ML IJ SOLN: 8.0000 mg | Freq: Once | INTRAMUSCULAR | Status: AC

## 2019-10-15 MED ORDER — CEFAZOLIN SODIUM-DEXTROSE 2-4 GM/100ML-% IV SOLN
2.0000 g | INTRAVENOUS | Status: AC
  Administered 2019-10-15: 2 g via INTRAVENOUS

## 2019-10-15 MED ORDER — GABAPENTIN 300 MG PO CAPS
ORAL_CAPSULE | ORAL | Status: AC
  Administered 2019-10-15: 300 mg via ORAL
  Filled 2019-10-15: qty 1

## 2019-10-15 MED ORDER — PANTOPRAZOLE SODIUM 40 MG PO TBEC: 40.0000 mg | DELAYED_RELEASE_TABLET | Freq: Two times a day (BID) | ORAL | Status: DC

## 2019-10-15 MED ORDER — CELECOXIB 200 MG PO CAPS: 200.0000 mg | ORAL_CAPSULE | Freq: Two times a day (BID) | ORAL | Status: DC

## 2019-10-15 MED ORDER — PHENYLEPHRINE HCL (PRESSORS) 10 MG/ML IV SOLN
INTRAVENOUS | Status: DC | PRN
  Administered 2019-10-15: 100 ug via INTRAVENOUS
  Administered 2019-10-15: 150 ug via INTRAVENOUS
  Administered 2019-10-15 (×3): 100 ug via INTRAVENOUS

## 2019-10-15 MED ORDER — GLYCOPYRROLATE 0.2 MG/ML IJ SOLN
INTRAMUSCULAR | Status: DC | PRN
  Administered 2019-10-15 (×2): .2 mg via INTRAVENOUS

## 2019-10-15 MED ORDER — METOCLOPRAMIDE HCL 10 MG PO TABS: 10.0000 mg | ORAL_TABLET | Freq: Three times a day (TID) | ORAL | Status: DC

## 2019-10-15 MED ORDER — METOCLOPRAMIDE HCL 5 MG/ML IJ SOLN: 5.0000 mg | Freq: Three times a day (TID) | INTRAMUSCULAR | Status: DC | PRN

## 2019-10-15 MED ORDER — FERROUS SULFATE 325 (65 FE) MG PO TABS: 325.0000 mg | ORAL_TABLET | Freq: Two times a day (BID) | ORAL | Status: DC

## 2019-10-15 MED ORDER — ENOXAPARIN SODIUM 40 MG/0.4ML ~~LOC~~ SOLN: 40.0000 mg | SUBCUTANEOUS | 0 refills | Status: DC

## 2019-10-15 MED ORDER — ONDANSETRON HCL 4 MG/2ML IJ SOLN: 4.0000 mg | Freq: Four times a day (QID) | INTRAMUSCULAR | Status: DC | PRN

## 2019-10-15 MED ORDER — CEFAZOLIN SODIUM-DEXTROSE 2-4 GM/100ML-% IV SOLN
2.0000 g | Freq: Four times a day (QID) | INTRAVENOUS | Status: DC
  Administered 2019-10-15: 2 g via INTRAVENOUS

## 2019-10-15 MED ORDER — ALUM & MAG HYDROXIDE-SIMETH 200-200-20 MG/5ML PO SUSP: 30.0000 mL | ORAL | Status: DC | PRN

## 2019-10-15 MED ORDER — OXYCODONE HCL 5 MG PO TABS: 10.0000 mg | ORAL_TABLET | ORAL | Status: DC | PRN

## 2019-10-15 MED ORDER — SENNOSIDES-DOCUSATE SODIUM 8.6-50 MG PO TABS: 1.0000 | ORAL_TABLET | Freq: Two times a day (BID) | ORAL | Status: DC

## 2019-10-15 MED ORDER — CHLORHEXIDINE GLUCONATE 4 % EX LIQD: 60.0000 mL | Freq: Once | CUTANEOUS | Status: DC

## 2019-10-15 MED ORDER — TRANEXAMIC ACID-NACL 1000-0.7 MG/100ML-% IV SOLN
INTRAVENOUS | Status: AC
  Filled 2019-10-15: qty 100

## 2019-10-15 MED ORDER — DEXMEDETOMIDINE HCL IN NACL 80 MCG/20ML IV SOLN
INTRAVENOUS | Status: AC
  Filled 2019-10-15: qty 20

## 2019-10-15 MED ORDER — BISACODYL 10 MG RE SUPP
10.0000 mg | Freq: Every day | RECTAL | Status: DC | PRN
  Filled 2019-10-15: qty 1

## 2019-10-15 MED ORDER — BUPIVACAINE HCL (PF) 0.25 % IJ SOLN
INTRAMUSCULAR | Status: AC
  Filled 2019-10-15: qty 60

## 2019-10-15 MED ORDER — ACETAMINOPHEN 10 MG/ML IV SOLN
INTRAVENOUS | Status: DC | PRN
  Administered 2019-10-15: 1000 mg via INTRAVENOUS

## 2019-10-15 MED ORDER — KETAMINE HCL 50 MG/ML IJ SOLN
INTRAMUSCULAR | Status: AC
  Filled 2019-10-15: qty 10

## 2019-10-15 MED ORDER — ONDANSETRON HCL 4 MG/2ML IJ SOLN
INTRAMUSCULAR | Status: DC | PRN
  Administered 2019-10-15: 4 mg via INTRAVENOUS

## 2019-10-15 MED ORDER — BUPIVACAINE HCL (PF) 0.25 % IJ SOLN
INTRAMUSCULAR | Status: DC | PRN
  Administered 2019-10-15: 60 mL

## 2019-10-15 MED ORDER — ACETAMINOPHEN 10 MG/ML IV SOLN
1000.0000 mg | Freq: Four times a day (QID) | INTRAVENOUS | Status: DC
  Administered 2019-10-15: 1000 mg via INTRAVENOUS

## 2019-10-15 MED ORDER — TRANEXAMIC ACID-NACL 1000-0.7 MG/100ML-% IV SOLN
INTRAVENOUS | Status: AC
  Administered 2019-10-15: 1000 mg via INTRAVENOUS
  Filled 2019-10-15: qty 100

## 2019-10-15 MED ORDER — CELECOXIB 200 MG PO CAPS: 200.0000 mg | ORAL_CAPSULE | Freq: Two times a day (BID) | ORAL | 2 refills | Status: AC

## 2019-10-15 MED ORDER — PROPOFOL 10 MG/ML IV BOLUS
INTRAVENOUS | Status: DC | PRN
  Administered 2019-10-15: 20 mg via INTRAVENOUS
  Administered 2019-10-15: 10 mg via INTRAVENOUS
  Administered 2019-10-15: 20 mg via INTRAVENOUS
  Administered 2019-10-15: 10 mg via INTRAVENOUS
  Administered 2019-10-15: 30 mg via INTRAVENOUS
  Administered 2019-10-15: 10 mg via INTRAVENOUS

## 2019-10-15 MED ORDER — OXYCODONE HCL 5 MG PO TABS: 5.0000 mg | ORAL_TABLET | ORAL | Status: DC | PRN

## 2019-10-15 MED ORDER — KETAMINE HCL 10 MG/ML IJ SOLN
INTRAMUSCULAR | Status: DC | PRN
  Administered 2019-10-15 (×4): 10 mg via INTRAVENOUS

## 2019-10-15 MED ORDER — FLEET ENEMA 7-19 GM/118ML RE ENEM: 1.0000 | ENEMA | Freq: Once | RECTAL | Status: DC | PRN

## 2019-10-15 MED ORDER — FENTANYL CITRATE (PF) 100 MCG/2ML IJ SOLN: 25.0000 ug | INTRAMUSCULAR | Status: DC | PRN

## 2019-10-15 MED ORDER — BUPIVACAINE HCL (PF) 0.5 % IJ SOLN
INTRAMUSCULAR | Status: DC | PRN
  Administered 2019-10-15: 3 mL via INTRATHECAL

## 2019-10-15 MED ORDER — CELECOXIB 200 MG PO CAPS
ORAL_CAPSULE | ORAL | Status: AC
  Administered 2019-10-15: 400 mg via ORAL
  Filled 2019-10-15: qty 2

## 2019-10-15 MED ORDER — MENTHOL 3 MG MT LOZG
1.0000 | LOZENGE | OROMUCOSAL | Status: DC | PRN
  Filled 2019-10-15: qty 9

## 2019-10-15 MED ORDER — GABAPENTIN 300 MG PO CAPS: 300.0000 mg | ORAL_CAPSULE | Freq: Once | ORAL | Status: AC

## 2019-10-15 MED ORDER — ENOXAPARIN SODIUM 40 MG/0.4ML ~~LOC~~ SOLN: 40.0000 mg | SUBCUTANEOUS | Status: DC

## 2019-10-15 MED ORDER — LACTATED RINGERS IV BOLUS
250.0000 mL | Freq: Once | INTRAVENOUS | Status: AC
  Administered 2019-10-15: 15:00:00 250 mL via INTRAVENOUS

## 2019-10-15 MED ORDER — EPHEDRINE SULFATE 50 MG/ML IJ SOLN
INTRAMUSCULAR | Status: AC
  Filled 2019-10-15: qty 1

## 2019-10-15 MED ORDER — SODIUM CHLORIDE (PF) 0.9 % IJ SOLN
INTRAMUSCULAR | Status: AC
  Filled 2019-10-15: qty 10

## 2019-10-15 MED ORDER — ONDANSETRON HCL 4 MG/2ML IJ SOLN
INTRAMUSCULAR | Status: AC
  Filled 2019-10-15: qty 2

## 2019-10-15 MED ORDER — DEXAMETHASONE SODIUM PHOSPHATE 10 MG/ML IJ SOLN
INTRAMUSCULAR | Status: AC
  Administered 2019-10-15: 8 mg via INTRAVENOUS
  Filled 2019-10-15: qty 1

## 2019-10-15 MED ORDER — SODIUM CHLORIDE 0.9 % IV SOLN: INTRAVENOUS | Status: DC

## 2019-10-15 MED ORDER — LACTATED RINGERS IV SOLN: INTRAVENOUS | Status: DC

## 2019-10-15 MED ORDER — OXYCODONE HCL 5 MG PO TABS: 5.0000 mg | ORAL_TABLET | ORAL | 0 refills | Status: DC | PRN

## 2019-10-15 MED ORDER — HYDROMORPHONE HCL 1 MG/ML IJ SOLN: 0.5000 mg | INTRAMUSCULAR | Status: DC | PRN

## 2019-10-15 MED ORDER — NEOMYCIN-POLYMYXIN B GU 40-200000 IR SOLN
Status: AC
  Filled 2019-10-15: qty 20

## 2019-10-15 MED ORDER — SODIUM CHLORIDE 0.9 % IV SOLN
INTRAVENOUS | Status: DC | PRN
  Administered 2019-10-15: 20 ug/min via INTRAVENOUS

## 2019-10-15 MED ORDER — FAMOTIDINE 20 MG PO TABS: 20.0000 mg | ORAL_TABLET | Freq: Once | ORAL | Status: AC

## 2019-10-15 MED ORDER — LACTATED RINGERS IV BOLUS
250.0000 mL | Freq: Once | INTRAVENOUS | Status: AC
  Administered 2019-10-15: 250 mL via INTRAVENOUS

## 2019-10-15 MED ORDER — PHENOL 1.4 % MT LIQD
1.0000 | OROMUCOSAL | Status: DC | PRN
  Filled 2019-10-15: qty 177

## 2019-10-15 MED ORDER — TRANEXAMIC ACID-NACL 1000-0.7 MG/100ML-% IV SOLN: 1000.0000 mg | Freq: Once | INTRAVENOUS | Status: AC

## 2019-10-15 MED ORDER — FAMOTIDINE 20 MG PO TABS
ORAL_TABLET | ORAL | Status: AC
  Administered 2019-10-15: 20 mg via ORAL
  Filled 2019-10-15: qty 1

## 2019-10-15 MED ORDER — GLYCOPYRROLATE 0.2 MG/ML IJ SOLN
INTRAMUSCULAR | Status: AC
  Filled 2019-10-15: qty 1

## 2019-10-15 MED ORDER — PHENYLEPHRINE HCL (PRESSORS) 10 MG/ML IV SOLN
INTRAVENOUS | Status: AC
  Filled 2019-10-15: qty 1

## 2019-10-15 SURGICAL SUPPLY — 75 items
ATTUNE MED DOME PAT 38 KNEE (Knees) ×1 IMPLANT
ATTUNE PS FEM LT SZ 5 CEM KNEE (Femur) ×1 IMPLANT
ATTUNE PSRP INSR SZ5 5 KNEE (Insert) ×1 IMPLANT
BASE TIBIAL ROT PLAT SZ 5 KNEE (Knees) IMPLANT
BATTERY INSTRU NAVIGATION (MISCELLANEOUS) ×8 IMPLANT
BLADE SAW 70X12.5 (BLADE) ×2 IMPLANT
BLADE SAW 90X13X1.19 OSCILLAT (BLADE) ×2 IMPLANT
BLADE SAW 90X25X1.19 OSCILLAT (BLADE) ×2 IMPLANT
BONE CEMENT GENTAMICIN (Cement) ×4 IMPLANT
CANISTER SUCT 3000ML PPV (MISCELLANEOUS) ×2 IMPLANT
CEMENT BONE GENTAMICIN 40 (Cement) IMPLANT
COOLER POLAR GLACIER W/PUMP (MISCELLANEOUS) ×2 IMPLANT
COVER WAND RF STERILE (DRAPES) ×2 IMPLANT
CUFF TOURN SGL QUICK 24 (TOURNIQUET CUFF)
CUFF TOURN SGL QUICK 30 (TOURNIQUET CUFF) ×1
CUFF TRNQT CYL 24X4X16.5-23 (TOURNIQUET CUFF) IMPLANT
CUFF TRNQT CYL 30X4X21-28X (TOURNIQUET CUFF) IMPLANT
DRAPE 3/4 80X56 (DRAPES) ×2 IMPLANT
DRSG AQUACEL AG ADV 3.5X14 (GAUZE/BANDAGES/DRESSINGS) ×1 IMPLANT
DRSG DERMACEA 8X12 NADH (GAUZE/BANDAGES/DRESSINGS) ×1 IMPLANT
DRSG OPSITE POSTOP 4X14 (GAUZE/BANDAGES/DRESSINGS) ×1 IMPLANT
DRSG TEGADERM 4X4.75 (GAUZE/BANDAGES/DRESSINGS) ×1 IMPLANT
DURAPREP 26ML APPLICATOR (WOUND CARE) ×4 IMPLANT
ELECT REM PT RETURN 9FT ADLT (ELECTROSURGICAL) ×2
ELECTRODE REM PT RTRN 9FT ADLT (ELECTROSURGICAL) ×1 IMPLANT
EX-PIN ORTHOLOCK NAV 4X150 (PIN) ×4 IMPLANT
GLOVE BIO SURGEON STRL SZ7.5 (GLOVE) ×4 IMPLANT
GLOVE BIOGEL M STRL SZ7.5 (GLOVE) ×4 IMPLANT
GLOVE BIOGEL PI IND STRL 7.5 (GLOVE) ×1 IMPLANT
GLOVE BIOGEL PI INDICATOR 7.5 (GLOVE) ×1
GLOVE INDICATOR 8.0 STRL GRN (GLOVE) ×2 IMPLANT
GOWN STRL REUS W/ TWL LRG LVL3 (GOWN DISPOSABLE) ×2 IMPLANT
GOWN STRL REUS W/ TWL XL LVL3 (GOWN DISPOSABLE) ×1 IMPLANT
GOWN STRL REUS W/TWL LRG LVL3 (GOWN DISPOSABLE) ×2
GOWN STRL REUS W/TWL XL LVL3 (GOWN DISPOSABLE) ×1
HEMOVAC 400CC 10FR (MISCELLANEOUS) ×1 IMPLANT
HOLDER FOLEY CATH W/STRAP (MISCELLANEOUS) ×2 IMPLANT
HOOD PEEL AWAY FLYTE STAYCOOL (MISCELLANEOUS) ×4 IMPLANT
KIT TURNOVER KIT A (KITS) ×2 IMPLANT
KNIFE SCULPS 14X20 (INSTRUMENTS) ×2 IMPLANT
LABEL OR SOLS (LABEL) ×2 IMPLANT
MANIFOLD NEPTUNE II (INSTRUMENTS) ×2 IMPLANT
NDL SAFETY ECLIPSE 18X1.5 (NEEDLE) ×1 IMPLANT
NDL SPNL 20GX3.5 QUINCKE YW (NEEDLE) ×2 IMPLANT
NEEDLE HYPO 18GX1.5 SHARP (NEEDLE) ×1
NEEDLE SPNL 20GX3.5 QUINCKE YW (NEEDLE) ×4 IMPLANT
NS IRRIG 500ML POUR BTL (IV SOLUTION) ×2 IMPLANT
PACK TOTAL KNEE (MISCELLANEOUS) ×2 IMPLANT
PAD WRAPON POLAR KNEE (MISCELLANEOUS) ×1 IMPLANT
PENCIL SMOKE EVACUATOR COATED (MISCELLANEOUS) ×2 IMPLANT
PENCIL SMOKE ULTRAEVAC 22 CON (MISCELLANEOUS) ×2 IMPLANT
PIN DRILL QUICK PACK ×2 IMPLANT
PIN FIXATION 1/8DIA X 3INL (PIN) ×6 IMPLANT
PULSAVAC PLUS IRRIG FAN TIP (DISPOSABLE) ×2
SOL .9 NS 3000ML IRR  AL (IV SOLUTION) ×1
SOL .9 NS 3000ML IRR UROMATIC (IV SOLUTION) ×1 IMPLANT
SOL PREP PVP 2OZ (MISCELLANEOUS) ×2
SOLUTION PREP PVP 2OZ (MISCELLANEOUS) ×1 IMPLANT
SPONGE DRAIN TRACH 4X4 STRL 2S (GAUZE/BANDAGES/DRESSINGS) ×1 IMPLANT
STAPLER SKIN PROX 35W (STAPLE) ×2 IMPLANT
STOCKINETTE IMPERV 14X48 (MISCELLANEOUS) IMPLANT
STRAP TIBIA SHORT (MISCELLANEOUS) ×2 IMPLANT
SUCTION FRAZIER HANDLE 10FR (MISCELLANEOUS) ×1
SUCTION TUBE FRAZIER 10FR DISP (MISCELLANEOUS) ×1 IMPLANT
SUT VIC AB 0 CT1 36 (SUTURE) ×4 IMPLANT
SUT VIC AB 1 CT1 36 (SUTURE) ×4 IMPLANT
SUT VIC AB 2-0 CT2 27 (SUTURE) ×2 IMPLANT
SYR 20ML LL LF (SYRINGE) ×2 IMPLANT
SYR 30ML LL (SYRINGE) ×4 IMPLANT
TIBIAL BASE ROT PLAT SZ 5 KNEE (Knees) ×2 IMPLANT
TIP FAN IRRIG PULSAVAC PLUS (DISPOSABLE) ×1 IMPLANT
TOWEL OR 17X26 4PK STRL BLUE (TOWEL DISPOSABLE) ×2 IMPLANT
TOWER CARTRIDGE SMART MIX (DISPOSABLE) ×2 IMPLANT
TRAY FOLEY MTR SLVR 16FR STAT (SET/KITS/TRAYS/PACK) ×2 IMPLANT
WRAPON POLAR PAD KNEE (MISCELLANEOUS) ×2

## 2019-10-15 NOTE — OR Nursing (Signed)
Per PT, tele, ok to start fluid bolus now 1305.

## 2019-10-15 NOTE — Anesthesia Preprocedure Evaluation (Signed)
Anesthesia Evaluation  Patient identified by MRN, date of birth, ID band Patient awake    Reviewed: Allergy & Precautions, NPO status , Patient's Chart, lab work & pertinent test results  History of Anesthesia Complications (+) history of anesthetic complications (woke up during colonoscopy)  Airway Mallampati: II  TM Distance: >3 FB Neck ROM: Full    Dental  (+) Missing, Implants   Pulmonary sleep apnea , neg COPD,    breath sounds clear to auscultation- rhonchi (-) wheezing      Cardiovascular (-) hypertension+ Past MI  (-) Cardiac Stents and (-) CABG Dysrhythmias: LBBB.  Rhythm:Regular Rate:Normal - Systolic murmurs and - Diastolic murmurs    Neuro/Psych  Headaches, neg Seizures PSYCHIATRIC DISORDERS Anxiety Depression Bipolar Disorder    GI/Hepatic Neg liver ROS, GERD  ,  Endo/Other  Hypothyroidism   Renal/GU negative Renal ROS     Musculoskeletal   Abdominal (+) - obese,   Peds  Hematology  (+) anemia ,   Anesthesia Other Findings Past Medical History: No date: allergic rhinitis No date: Anemia No date: Anxiety No date: Arthritis No date: Basal cell carcinoma No date: Complication of anesthesia     Comment:  woke up during one colonoscopy No date: Depression No date: Eating disorder No date: Family history of adverse reaction to anesthesia     Comment:  paternal grandfather-overdosed on ether No date: GERD (gastroesophageal reflux disease)     Comment:  occ tums prn No date: Headache disorder     Comment:  h/o migraines No date: Heart murmur No date: Hx of colonic polyps No date: Hyperlipidemia No date: hypothyroidism No date: Hypothyroidism No date: Urinary incontinence   Reproductive/Obstetrics                            Anesthesia Physical Anesthesia Plan  ASA: III  Anesthesia Plan: Spinal   Post-op Pain Management:    Induction:   PONV Risk Score and Plan: 2  and Ondansetron, Dexamethasone and Midazolam  Airway Management Planned: Natural Airway  Additional Equipment:   Intra-op Plan:   Post-operative Plan:   Informed Consent: I have reviewed the patients History and Physical, chart, labs and discussed the procedure including the risks, benefits and alternatives for the proposed anesthesia with the patient or authorized representative who has indicated his/her understanding and acceptance.     Dental advisory given  Plan Discussed with: CRNA and Anesthesiologist  Anesthesia Plan Comments:         Anesthesia Quick Evaluation

## 2019-10-15 NOTE — Anesthesia Postprocedure Evaluation (Signed)
Anesthesia Post Note  Patient: Kelly Vaughn  Procedure(s) Performed: COMPUTER ASSISTED TOTAL KNEE ARTHROPLASTY (Left Knee)  Patient location during evaluation: PACU Anesthesia Type: Spinal Level of consciousness: oriented and awake and alert Pain management: pain level controlled Vital Signs Assessment: post-procedure vital signs reviewed and stable Respiratory status: spontaneous breathing, respiratory function stable and nonlabored ventilation Cardiovascular status: blood pressure returned to baseline and stable Postop Assessment: no headache, no backache and spinal receding Anesthetic complications: no     Last Vitals:  Vitals:   10/15/19 1215 10/15/19 1230  BP:  127/68  Pulse: 88 81  Resp: 15 16  Temp:  (!) 36.4 C  SpO2: 98% 100%    Last Pain:  Vitals:   10/15/19 1230  TempSrc: Temporal  PainSc: 0-No pain                 Hadasah Brugger

## 2019-10-15 NOTE — Transfer of Care (Signed)
Immediate Anesthesia Transfer of Care Note  Patient: Kelly Vaughn  Procedure(s) Performed: COMPUTER ASSISTED TOTAL KNEE ARTHROPLASTY (Left Knee)  Patient Location: PACU  Anesthesia Type:General  Level of Consciousness: drowsy  Airway & Oxygen Therapy: Patient Spontanous Breathing and Patient connected to face mask oxygen  Post-op Assessment: Report given to RN and Post -op Vital signs reviewed and stable  Post vital signs: Reviewed and stable  Last Vitals:  Vitals Value Taken Time  BP 90/69 10/15/19 1104  Temp    Pulse 115 10/15/19 1107  Resp 18 10/15/19 1107  SpO2 100 % 10/15/19 1107  Vitals shown include unvalidated device data.  Last Pain:  Vitals:   10/15/19 0621  TempSrc: Temporal  PainSc: 4          Complications: No apparent anesthesia complications

## 2019-10-15 NOTE — Evaluation (Signed)
Occupational Therapy Evaluation Patient Details Name: Kelly Vaughn MRN: [id number] DOB: [birth year] Today's Date: 10/15/2019    History of Present Illness pt is a 64 yo female s/p L TKA on 10/15/19. PMH includes bipolar affective disorder with mania, depression, UI, memory loss, hypothyroidism   Clinical Impression   Pt seen for OT evaluation this date, POD#0 from above surgery. Pt was independent in all ADL prior to surgery and is eager to return to PLOF with less pain and improved safety and independence. Pt currently requires PRN minimal assist for LB dressing and bathing while in seated position due to limited AROM of L knee, strength, and balance. Spouse able to provide needed level of assist. Pt/spouse instructed in pet care considerations, RW mgt in bathroom vs kitchen, polar care mgt, falls prevention strategies, home/routines modifications, DME/AE for LB bathing and dressing tasks, and compression stocking mgt. Pt/spouse verbalized understanding. Do not anticipate any further skilled OT needs at this time. Will sign off.       Follow Up Recommendations  No OT follow up    Equipment Recommendations  3 in 1 bedside commode;Tub/shower seat    Recommendations for Other Services       Precautions / Restrictions Precautions Precautions: Fall Restrictions Weight Bearing Restrictions: Yes LLE Weight Bearing: Weight bearing as tolerated      Mobility Bed Mobility Overal bed mobility: Modified Independent             General bed mobility comments: mildly increased time and effort to get EOB, no physical assist required  Transfers Overall transfer level: Needs assistance Equipment used: Rolling walker (2 wheeled) Transfers: Sit to/from Stand Sit to Stand: Min guard         General transfer comment: min guard for safety, pt educated on hand placement and safe transfer with RW, pt overall steady with transfer    Balance Overall balance assessment: Mild deficits  observed, not formally tested                                         ADL either performed or assessed with clinical judgement   ADL Overall ADL's : Needs assistance/impaired                                       General ADL Comments: CGA for functional ADL mobility, PRN min assist for LB ADL, spouse able to provide     Vision Baseline Vision/History: Wears glasses Wears Glasses: At all times Patient Visual Report: No change from baseline       Perception     Praxis      Pertinent Vitals/Pain Pain Assessment: No/denies pain     Hand Dominance Right   Extremity/Trunk Assessment Upper Extremity Assessment Upper Extremity Assessment: Overall WFL for tasks assessed   Lower Extremity Assessment Lower Extremity Assessment: Overall WFL for tasks assessed;LLE deficits/detail;Defer to PT evaluation LLE Deficits / Details: expected post-op strength/ROM deficits   Cervical / Trunk Assessment Cervical / Trunk Assessment: Normal   Communication Communication Communication: No difficulties   Cognition Arousal/Alertness: Awake/alert Behavior During Therapy: WFL for tasks assessed/performed Overall Cognitive Status: Within Functional Limits for tasks assessed  General Comments       Exercises  Other Exercises: Pt/spouse instructed in falls prevention, pet care considerations, compression stocking mgt, polar care mgt, showering, and AE/DME for ADL, RW mgt in kitchen vs bathroom; handout provided   Shoulder Instructions      Home Living Family/patient expects to be discharged to:: Private residence Living Arrangements: Spouse/significant other Available Help at Discharge: Family;Available 24 hours/day Type of Home: House Home Access: Stairs to enter [employer] of Steps: 2 Entrance Stairs-Rails: None Home Layout: One level     Bathroom Shower/Tub: Tub/shower unit;Walk-in  shower   Bathroom Toilet: Standard     Home Equipment: Crutches;Cane - single point;Hand held shower head;Adaptive equipment Adaptive Equipment: Reacher Additional Comments: plan to get a shower chair      Prior Functioning/Environment Level of Independence: Independent        Comments: pt reports independent with all ADLs, 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, still drives        OT Problem List: Decreased strength;Decreased range of motion;Impaired balance (sitting and/or standing);Decreased knowledge of use of DME or AE      OT Treatment/Interventions:      OT Goals(Current goals can be found in the care plan section) Acute Rehab OT Goals Patient Stated Goal: go home OT Goal Formulation: All assessment and education complete, DC therapy  OT Frequency:     Barriers to D/C:            Co-evaluation              AM-PAC OT "6 Clicks" Daily Activity     Outcome Measure Help from another person eating meals?: None Help from another person taking care of personal grooming?: None Help from another person toileting, which includes using toliet, bedpan, or urinal?: A Little Help from another person bathing (including washing, rinsing, drying)?: A Little Help from another person to put on and taking off regular upper body clothing?: None Help from another person to put on and taking off regular lower body clothing?: A Little 6 Click Score: 21   End of Session    Activity Tolerance: Patient tolerated treatment well Patient left: in chair;with call bell/phone within reach;with nursing/sitter in room;with family/visitor present;with SCD's reapplied;Other (comment)(polar care in place)  OT Visit Diagnosis: Other abnormalities of gait and mobility (R26.89)                Time: [id number] OT Time Calculation (min): 35 min Charges:  OT General Charges $OT Visit: 1 Visit OT Evaluation $OT Eval Low Complexity: 1 Low OT Treatments $Self Care/Home Management : 23-37 mins  Jeni Salles, MPH,  MS, OTR/L ascom [phone] 10/15/19, 4:07 PM

## 2019-10-15 NOTE — OR Nursing (Signed)
PT in for evaluation 1349.

## 2019-10-15 NOTE — OR Nursing (Signed)
OT in for consult 1454.

## 2019-10-15 NOTE — Evaluation (Signed)
Physical Therapy Evaluation Patient Details Name: Kelly Vaughn MRN: [id number] DOB: 08-May-1956 Today's Date: 10/15/2019   History of Present Illness  pt is a 64 yo female s/p L TKA on 10/15/19. PMH includes bipolar affective disorder with mania, depression, UI, memory loss, hypothyroidism  Clinical Impression  Pt is a 63 yo s/p L TKA. Pt eager for PT and spouse present for PT session. Pt reports living in a single level home with 2 STE with no handrails. Pt reporting no pain during session (pt premedicated). Pt mod I to get EOB and min guard for functional mobility including transfers, gait and stair training. Pt ambulated to/from restroom and in hall for ~49ft and then another ~128ft following stair training. Pt instructed and provided demonstration for use of RW for transfers, gait and stairs. Pt required min cuing throughout gait training with pt progressing to step through gait pattern with no buckling, LOB or unsteadiness noted. Pt educated on backwards stair technique with RW with spouse also educated on how to best assist and guard. Pt ascended/descended three steps safely with min guard assist and min vc. Spouse provided with handout for stair navigation and HEP. Pt educated on HEP and demo correct performance of exercises. Pt able to perform SLR indepedendently. Pt presents with decreased ROM (-5-45), balance, strength and activity tolerance consistent with recent surgery. Pt educated on car transfers with therapist providing demonstration. Pt and spouse educated on use of bone foam and resting LE in knee extension, polar care with encouragement of frequent use, and encouraged to continue working on/progressing ROM and gradually increasing ambulation/activity within the home as well as ambulating to restroom. Pt and spouse instructed on POC and when to expect home health PT. All spouse and pt questions addressed throughout session and both feel comfortable with functional mobility and returning  home. Recommendation for home health PT to continue improving noted deficits and progressing mobility to allow for return to PLOF.     Follow Up Recommendations Home health PT;Follow surgeon's recommendation for DC plan and follow-up therapies    Equipment Recommendations  Rolling walker with 5" wheels;3in1 (PT)    Recommendations for Other Services       Precautions / Restrictions Precautions Precautions: Fall Restrictions Weight Bearing Restrictions: Yes LLE Weight Bearing: Weight bearing as tolerated      Mobility  Bed Mobility Overal bed mobility: Modified Independent             General bed mobility comments: mildly increased time and effort to get EOB, no physical assist required  Transfers Overall transfer level: Needs assistance Equipment used: Rolling walker (2 wheeled) Transfers: Sit to/from Stand Sit to Stand: Min guard         General transfer comment: min guard for safety, pt educated on hand placement and safe transfer with RW, pt overall steady with transfer  Ambulation/Gait Ambulation/Gait assistance: Min guard Gait Distance (Feet): 175 Feet Assistive device: Rolling walker (2 wheeled) Gait Pattern/deviations: Step-to pattern;Step-through pattern;Decreased stride length;Decreased weight shift to left;Decreased stance time - left;Antalgic Gait velocity: decreased   General Gait Details: mily antalgic gait pattern, pt ambulated to/from restroom and at least 77ft with RW before rolling in recliner to stairs, pt ambulated another ~100 ft post stairs, pt educated on RW use and on step to pattern with pt progressing to periods of step through gait pattern, no buckling, LOB or unsteadiness noted  Stairs            Wheelchair Mobility    Modified  Rankin (Stroke Patients Only)       Balance Overall balance assessment: Mild deficits observed, not formally tested                                           Pertinent  Vitals/Pain Pain Assessment: No/denies pain    Home Living Family/patient expects to be discharged to:: Private residence Living Arrangements: Spouse/significant other Available Help at Discharge: Family;Available 24 hours/day Type of Home: House Home Access: Stairs to enter Entrance Stairs-Rails: None Entrance Stairs-Number of Steps: 2 Home Layout: One level Home Equipment: Crutches;Cane - single point      Prior Function Level of Independence: Independent         Comments: pt reports independent with all ADLs, 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, still Dispensing optician        Extremity/Trunk Assessment   Upper Extremity Assessment Upper Extremity Assessment: Overall WFL for tasks assessed    Lower Extremity Assessment Lower Extremity Assessment: Overall WFL for tasks assessed;LLE deficits/detail LLE Deficits / Details: dec strength, ROM, and balance consistent with recent surgery    Cervical / Trunk Assessment Cervical / Trunk Assessment: Normal  Communication   Communication: No difficulties  Cognition Arousal/Alertness: Awake/alert Behavior During Therapy: WFL for tasks assessed/performed Overall Cognitive Status: Within Functional Limits for tasks assessed                                        General Comments      Exercises Total Joint Exercises Ankle Circles/Pumps: AROM;Both;10 reps Quad Sets: AROM;Left;10 reps Heel Slides: AROM;Left;5 reps Straight Leg Raises: AROM;Left;5 reps Goniometric ROM: -5-45 Other Exercises Other Exercises: pt educated on HEP and provided handout including AP, quad sets, SLR, Hip ABD, heel slides, LAQ, and knee flexion Other Exercises: pt and spouse instructed on resting LE in extension and not placing pillow underneath operative knee   Assessment/Plan    PT Assessment Patient needs continued PT services  PT Problem List Decreased strength;Decreased mobility;Decreased safety awareness;Decreased range of  motion;Decreased activity tolerance;Decreased balance;Decreased knowledge of use of DME;Pain;Decreased knowledge of precautions       PT Treatment Interventions DME instruction;Therapeutic exercise;Gait training;Balance training;Stair training;Neuromuscular re-education;Functional mobility training;Therapeutic activities;Patient/family education    PT Goals (Current goals can be found in the Care Plan section)  Acute Rehab PT Goals Patient Stated Goal: go home PT Goal Formulation: With patient Time For Goal Achievement: 10/29/19 Potential to Achieve Goals: Good    Frequency BID   Barriers to discharge        Co-evaluation               AM-PAC PT "6 Clicks" Mobility  Outcome Measure Help needed turning from your back to your side while in a flat bed without using bedrails?: A Little Help needed moving from lying on your back to sitting on the side of a flat bed without using bedrails?: A Little Help needed moving to and from a bed to a chair (including a wheelchair)?: A Little Help needed standing up from a chair using your arms (e.g., wheelchair or bedside chair)?: A Little Help needed to walk in hospital room?: A Little Help needed climbing 3-5 steps with a railing? : A Little 6 Click Score: 18    End of Session Equipment  Utilized During Treatment: Gait belt Activity Tolerance: Patient tolerated treatment well Patient left: in chair;with family/visitor present Nurse Communication: Mobility status PT Visit Diagnosis: Other abnormalities of gait and mobility (R26.89);Difficulty in walking, not elsewhere classified (R26.2)    Time: 1345-1445 PT Time Calculation (min) (ACUTE ONLY): 60 min   Charges:   PT Evaluation $PT Eval Moderate Complexity: 1 Mod PT Treatments $Gait Training: 8-22 mins $Therapeutic Exercise: 8-22 mins $Therapeutic Activity: 8-22 mins        Addelyn Alleman PT, DPT 3:16 PM,10/15/19 [phone]   Jonee Lamore Drucilla Chalet 10/15/2019, 3:10 PM

## 2019-10-15 NOTE — H&P (Signed)
The patient has been re-examined, and the chart reviewed, and there have been no interval changes to the documented history and physical.    The risks, benefits, and alternatives have been discussed at length. The patient expressed understanding of the risks benefits and agreed with plans for surgical intervention.  Elfego Giammarino P. Neaveh Belanger, Jr. M.D.    

## 2019-10-15 NOTE — Anesthesia Procedure Notes (Signed)
Spinal  Patient location during procedure: OR Start time: 10/15/2019 7:21 AM End time: 10/15/2019 7:29 AM Staffing Performed: resident/CRNA  Anesthesiologist: Emmie Niemann, MD Resident/CRNA: Lia Foyer, CRNA Preanesthetic Checklist Completed: patient identified, IV checked, site marked, risks and benefits discussed, surgical consent, monitors and equipment checked, pre-op evaluation and timeout performed Spinal Block Patient position: sitting Prep: ChloraPrep Patient monitoring: heart rate, continuous pulse ox and blood pressure Approach: midline Location: L3-4 Injection technique: single-shot Needle Needle type: Introducer and Pencil-Tip  Needle gauge: 24 G Needle length: 9 cm Assessment Sensory level: T4

## 2019-10-15 NOTE — Discharge Instructions (Addendum)
Instructions after Total Knee Replacement   James P. Hooten, Jr., M.D.     Dept. of Orthopaedics & Sports Medicine  [facility]  [address]  [place], [place]  [address]  Phone: [phone]   Fax: [phone]    DIET: . Drink plenty of non-alcoholic fluids. . Resume your normal diet. Include foods high in fiber.  ACTIVITY:  . You may use crutches or a walker with weight-bearing as tolerated, unless instructed otherwise. . You may be weaned off of the walker or crutches by your Physical Therapist.  . Do NOT place pillows under the knee. Anything placed under the knee could limit your ability to straighten the knee.   . Continue doing gentle exercises. Exercising will reduce the pain and swelling, increase motion, and prevent muscle weakness.   . Please continue to use the TED compression stockings for 6 weeks. You may remove the stockings at night, but should reapply them in the morning. . Do not drive or operate any equipment until instructed.  WOUND CARE:  . Continue to use the PolarCare or ice packs periodically to reduce pain and swelling. . You may bathe or shower after the staples are removed at the first office visit following surgery.  MEDICATIONS: . You may resume your regular medications. . Please take the pain medication as prescribed on the medication. . Do not take pain medication on an empty stomach. . You have been given a prescription for a blood thinner (Lovenox or Coumadin). Please take the medication as instructed. (NOTE: After completing a 2 week course of Lovenox, take one Enteric-coated aspirin once a day. This along with elevation will help reduce the possibility of phlebitis in your operated leg.) . Do not drive or drink alcoholic beverages when taking pain medications.  CALL THE OFFICE FOR: . Temperature above 101 degrees . Excessive bleeding or drainage on the dressing. . Excessive swelling, coldness, or paleness of the toes. . Persistent  nausea and vomiting.  FOLLOW-UP:  . You should have an appointment to return to the office in 10-14 days after surgery. . Arrangements have been made for continuation of Physical Therapy (either home therapy or outpatient therapy).     [facility] Department Directory         [email or web address]       [email or web address]          Cardiology  Appointments: [place] - [phone] [place] - [phone]  Endocrinology  Appointments: [place] - [phone] [place] - [phone]  Gastroenterology  Appointments: [place] - [phone] [place] - [phone]        General Surgery   Appointments: [place] - [phone]  Internal Medicine/Family Medicine  Appointments: [place] - [phone] [place] - [phone] [place] - [phone]  Metabolic and Weigh Loss Surgery  Appointments: [place] - [phone]        Neurology  Appointments: [place] - [phone] [place] - [phone]  Neurosurgery  Appointments: [place] - [phone]  Obstetrics & Gynecology  Appointments: [place] - [phone] [place] - [phone]        Pediatrics  Appointments: [place] - [phone] [place] - [phone]  Physiatry  Appointments: [place] -[phone]  Physical Therapy  Appointments: [place] - [phone] [place] - [phone]        Podiatry  Appointments: [place] - [phone] [place] - [phone]  Pulmonology  Appointments: [place] - [phone]  Rheumatology  Appointments: [place] - [phone]        [place] Location: [facility]  [address] [place], [place]  [address]  [place] Location: [facility] [address] [place], [place]  [address]  [place] Location: [facility] [address] [place], [place]  [address]      AMBULATORY SURGERY  DISCHARGE INSTRUCTIONS   1) The drugs that you were given will stay in your system until tomorrow so for  the next 24 hours you should not:  A) Drive an automobile B) Make any legal decisions C) Drink any alcoholic beverage   2) You may resume regular meals tomorrow.  Today it is better to start with liquids and gradually work up to solid foods.  You may eat anything you prefer, but it is better to start with liquids, then soup and crackers, and gradually work up to solid foods.   3) Please notify your doctor immediately if you have any unusual bleeding, trouble breathing, redness and pain at the surgery site, drainage, fever, or pain not relieved by medication.    4) Additional Instructions:        Please contact your physician with any problems or Same Day Surgery at [phone], Monday through Friday 6 am to 4 pm, or [place] at [place] Main number at [phone]. 

## 2019-10-15 NOTE — Op Note (Signed)
OPERATIVE NOTE  DATE OF SURGERY:  10/15/2019  PATIENT NAME:  Kelly Vaughn   DOB: Aug 19, 1956  MRN: [id number]  PRE-OPERATIVE DIAGNOSIS: Degenerative arthrosis of the left knee, primary  POST-OPERATIVE DIAGNOSIS:  Same  PROCEDURE:  Left total knee arthroplasty using computer-assisted navigation  SURGEON:  Marciano Sequin. M.D.  ASSISTANT: Cassell Smiles, PA-C (present and scrubbed throughout the case, critical for assistance with exposure, retraction, instrumentation, and closure)  ANESTHESIA: spinal  ESTIMATED BLOOD LOSS: 50 mL  FLUIDS REPLACED: 1200 mL of crystalloid  TOURNIQUET TIME: 84 minutes  DRAINS: 2 medium Hemovac drains  SOFT TISSUE RELEASES: Anterior cruciate ligament, posterior cruciate ligament, deep medial collateral ligament, patellofemoral ligament  IMPLANTS UTILIZED: DePuy Attune size 5 posterior stabilized femoral component (cemented), size 5 rotating platform tibial component (cemented), 38 mm medialized dome patella (cemented), and a 5 mm stabilized rotating platform polyethylene insert.  INDICATIONS FOR SURGERY: Kelly Vaughn is a 64 y.o. year old female with a long history of progressive knee pain. X-rays demonstrated severe degenerative changes in tricompartmental fashion. The patient had not seen any significant improvement despite conservative nonsurgical intervention. After discussion of the risks and benefits of surgical intervention, the patient expressed understanding of the risks benefits and agree with plans for total knee arthroplasty.   The risks, benefits, and alternatives were discussed at length including but not limited to the risks of infection, bleeding, nerve injury, stiffness, blood clots, the need for revision surgery, cardiopulmonary complications, among others, and they were willing to proceed.  PROCEDURE IN DETAIL: The patient was brought into the operating room and, after adequate spinal anesthesia was achieved, a tourniquet was placed  on the patient's upper thigh. The patient's knee and leg were cleaned and prepped with alcohol and DuraPrep and draped in the usual sterile fashion. A "timeout" was performed as per usual protocol. The lower extremity was exsanguinated using an Esmarch, and the tourniquet was inflated to 300 mmHg. An anterior longitudinal incision was made followed by a standard mid vastus approach. The deep fibers of the medial collateral ligament were elevated in a subperiosteal fashion off of the medial flare of the tibia so as to maintain a continuous soft tissue sleeve. The patella was subluxed laterally and the patellofemoral ligament was incised. Inspection of the knee demonstrated severe degenerative changes with full-thickness loss of articular cartilage. Osteophytes were debrided using a rongeur. Anterior and posterior cruciate ligaments were excised. Two 4.0 mm Schanz pins were inserted in the femur and into the tibia for attachment of the array of trackers used for computer-assisted navigation. Hip center was identified using a circumduction technique. Distal landmarks were mapped using the computer. The distal femur and proximal tibia were mapped using the computer. The distal femoral cutting guide was positioned using computer-assisted navigation so as to achieve a 5 distal valgus cut. The femur was sized and it was felt that a size 5 femoral component was appropriate. A size 5 femoral cutting guide was positioned and the anterior cut was performed and verified using the computer. This was followed by completion of the posterior and chamfer cuts. Femoral cutting guide for the central box was then positioned in the center box cut was performed.  Attention was then directed to the proximal tibia. Medial and lateral menisci were excised. The extramedullary tibial cutting guide was positioned using computer-assisted navigation so as to achieve a 0 varus-valgus alignment and 3 posterior slope. The cut was performed and  verified using the computer. The proximal tibia was  sized and it was felt that a size 5 tibial tray was appropriate. Tibial and femoral trials were inserted followed by insertion of a 5 mm polyethylene insert. This allowed for excellent mediolateral soft tissue balancing both in flexion and in full extension. Finally, the patella was cut and prepared so as to accommodate a 38 mm medialized dome patella. A patella trial was placed and the knee was placed through a range of motion with excellent patellar tracking appreciated. The femoral trial was removed after debridement of posterior osteophytes. The central post-hole for the tibial component was reamed followed by insertion of a keel punch. Tibial trials were then removed. Cut surfaces of bone were irrigated with copious amounts of normal saline with antibiotic solution using pulsatile lavage and then suctioned dry. Polymethylmethacrylate cement with gentamicin was prepared in the usual fashion using a vacuum mixer. Cement was applied to the cut surface of the proximal tibia as well as along the undersurface of a size 5 rotating platform tibial component. Tibial component was positioned and impacted into place. Excess cement was removed using [employer] fast streamer. Cement was then applied to the cut surfaces of the femur as well as along the posterior flanges of the size 5 femoral component. The femoral component was positioned and impacted into place. Excess cement was removed using [employer] fast streamer. A 5 mm polyethylene trial was inserted and the knee was brought into full extension with steady axial compression applied. Finally, cement was applied to the backside of a 38 mm medialized dome patella and the patellar component was positioned and patellar clamp applied. Excess cement was removed using [employer] fast streamer. After adequate curing of the cement, the tourniquet was deflated after a total tourniquet time of 84 minutes. Hemostasis was achieved using electrocautery.  The knee was irrigated with copious amounts of normal saline with antibiotic solution using pulsatile lavage and then suctioned dry. 20 mL of 1.3% Exparel and 60 mL of 0.25% Marcaine in 40 mL of normal saline was injected along the posterior capsule, medial and lateral gutters, and along the arthrotomy site. A 5 mm stabilized rotating platform polyethylene insert was inserted and the knee was placed through a range of motion with excellent mediolateral soft tissue balancing appreciated and excellent patellar tracking noted. 2 medium drains were placed in the wound bed and brought out through separate stab incisions. The medial parapatellar portion of the incision was reapproximated using interrupted sutures of #1 Vicryl. Subcutaneous tissue was approximated in layers using first #0 Vicryl followed #2-0 Vicryl. The skin was approximated with skin staples. A sterile dressing was applied.  The patient tolerated the procedure well and was transported to the recovery room in stable condition.    Leotis Isham P. Holley Bouche., M.D.

## 2019-10-15 NOTE — OR Nursing (Signed)
Dr. Marry Guan in to see pt postop 1538.

## 2019-10-15 NOTE — Anesthesia Procedure Notes (Signed)
Performed by: Lia Foyer, CRNA

## 2019-10-17 NOTE — Progress Notes (Signed)
[facility]  Physical Therapy Certification  Patient Details  Name: Kelly Vaughn MRN: [id number] Date of Birth: 13-Dec-1955 Medical Diagnosis: Active Problems:   * No active hospital problems. *  Visit Diagnosis: PT Visit Diagnosis: Other abnormalities of gait and mobility (R26.89), Difficulty in walking, not elsewhere classified (R26.2) PT Problem: Decreased strength, Decreased mobility, Decreased safety awareness, Decreased range of motion, Decreased activity tolerance, Decreased balance, Decreased knowledge of use of DME, Pain, Decreased knowledge of precautions  Goals: Patient will transfer sit to/from stand: with modified independence Pt will Ambulate: > 125 feet, with modified independence, with rolling walker Pt will Go Up / Down Stairs: 1-2 stairs, with supervision, with rolling walker Pt/caregiver will Perform Home Exercise Program: For increased ROM, For increased strengthening, For improved balance, Independently  Duration: Services will be provided through the following date: 10/29/19  Frequency: BID  Amount: one treatment session per day unless otherwise indicated.    Certification Start Date: [id number] Certification End Date: 10/27/19   PT Treatments/Interventions: DME instruction, Therapeutic exercise, Gait training, Balance training, Stair training, Neuromuscular re-education, Functional mobility training, Therapeutic activities, Patient/family education   [place]. Owens Shark, PT, DPT, NCS 10/17/19, 3:15 PM [phone]

## 2019-12-02 DIAGNOSIS — Z96652 Presence of left artificial knee joint: Secondary | ICD-10-CM | POA: Insufficient documentation

## 2020-01-02 ENCOUNTER — Other Ambulatory Visit: Payer: Self-pay | Admitting: Nurse Practitioner

## 2020-01-02 DIAGNOSIS — Z1231 Encounter for screening mammogram for malignant neoplasm of breast: Secondary | ICD-10-CM

## 2020-01-21 ENCOUNTER — Other Ambulatory Visit: Payer: Self-pay | Admitting: Nurse Practitioner

## 2020-01-21 DIAGNOSIS — N644 Mastodynia: Secondary | ICD-10-CM

## 2020-01-24 ENCOUNTER — Telehealth: Payer: Self-pay | Admitting: Gastroenterology

## 2020-01-24 ENCOUNTER — Encounter: Payer: Self-pay | Admitting: Gastroenterology

## 2020-01-24 NOTE — Telephone Encounter (Signed)
Left vm for pt to return my call. Advised pt on her voicemail that I can not give her any medical advise as we have never seen her before and I don't know what is causing her discomfort. Advised her if she wants to be seen sooner, we can offer an appt with another GI physician in our practice.

## 2020-01-24 NOTE — Telephone Encounter (Signed)
Please call to triage abdominal pain

## 2020-01-25 NOTE — Telephone Encounter (Signed)
Mychart message has been sent to the patient requesting her to contact our office for a sooner appt.

## 2020-01-30 ENCOUNTER — Ambulatory Visit
Admission: RE | Admit: 2020-01-30 | Discharge: 2020-01-30 | Disposition: A | Discharge status: Home / Self Care | Source: Ambulatory Visit | Attending: Nurse Practitioner | Admitting: Nurse Practitioner

## 2020-01-30 ENCOUNTER — Other Ambulatory Visit: Payer: Self-pay

## 2020-01-30 DIAGNOSIS — N644 Mastodynia: Secondary | ICD-10-CM

## 2020-02-20 ENCOUNTER — Other Ambulatory Visit: Payer: Self-pay

## 2020-02-20 ENCOUNTER — Ambulatory Visit (INDEPENDENT_AMBULATORY_CARE_PROVIDER_SITE_OTHER): Admitting: Gastroenterology

## 2020-02-20 VITALS — BP 98/65 | HR 66 | Temp 98.2°F | Ht 67.0 in | Wt 189.8 lb

## 2020-02-20 DIAGNOSIS — R1013 Epigastric pain: Secondary | ICD-10-CM

## 2020-02-20 DIAGNOSIS — R1010 Upper abdominal pain, unspecified: Secondary | ICD-10-CM

## 2020-02-20 MED ORDER — HYOSCYAMINE SULFATE 0.125 MG SL SUBL: 0.1250 mg | SUBLINGUAL_TABLET | SUBLINGUAL | 0 refills | Status: DC | PRN

## 2020-02-20 NOTE — Progress Notes (Addendum)
Gastroenterology Consultation  Referring Provider:     Danelle Berry, NP Primary Care Physician:  Danelle Berry, NP Primary Gastroenterologist:  Dr. Allen Norris     Reason for Consultation:     Abdominal pain        HPI:   ALEYSHKA CIFUENTES is a 64 y.o. y/o female referred for consultation & management of abdominal pain by Dr. Danelle Berry, NP.  This patient comes in today after a referral was put in on April 22 by her primary care provider for abdominal pain.  The patient has previously seen 2 separate physicians at the [facility] with a colonoscopy by Dr. Alice Reichert on August 6 of 2019.  The patient has a personal history of colon polyps and a family history of colon polyps.  The patient had started calling our office in the beginning of May asking for medical advice for her abdominal pain.  She was notified that she had never been seen by any of our providers and to make an appointment.  The patient then had subsequent communications with [place] trying to get a sooner appointment and was put in to be seen by me today.  It does not seem that the patient has had any recent interaction with her primary care provider for the abdominal pain nor has she had any blood work or imaging to evaluate the abdominal discomfort.  The patient is followed by cardiology, orthopedics and physical therapy. The patient reports that all of her symptoms started after having orthopedic surgery and being started on Tylenol and NSAIDs.  She says that she did [employer] and found out that Naprosyn was less harmful to the GI tract.  She also states that she looked bilateral flank pain and says that her research suggested pancreatic problems.  The patient reports that she has a sister with pancreatic cancer who underwent a Whipple's procedure and had abdominal pain like hers.  The patient reports that she had a CT scan of the abdomen that did not show any sign of pancreatic issues or cause for her pain. The patient  was tried on dicyclomine 10 mg twice a day by her primary care provider and reports that she did not have any symptom improvement but did have side effects that she related to the medication.  The patient has a history of an MI, obstructive sleep apnea, hypertrophic cardiomyopathy, hyperlipidemia, shortness of breath, MI, hypertension, bipolar disorder and palpitations.  The patient has asked, after reviewing my note (via email) that her note should read:   "Patient Kaizlee Koopmans made an appointment in April to see me and the first opening was on June 2. Her pain grew significantly worse and she called the office the first week of May to ask to be put on the waiting list to be seen earlier by any doctor. She was told to go to the ER because there were no openings for her.  Pabla saw her  primary doctor two times in April and May for her abdominal pain which started after her knee surgery this spring. Margurite Auerbach, NP, ordered a CAT scan and blood work due to multiple symptoms and  patient having two sisters with Pancreatic Cancer. "   Past Medical History:  Diagnosis Date  . allergic rhinitis   . Anemia   . Anxiety   . Arthritis   . Basal cell carcinoma   . Complication of anesthesia    woke up during one colonoscopy  . Depression   .  Eating disorder   . Family history of adverse reaction to anesthesia    paternal grandfather-overdosed on ether  . GERD (gastroesophageal reflux disease)    occ tums prn  . Headache disorder    h/o migraines  . Heart murmur   . Hx of colonic polyps   . Hyperlipidemia   . hypothyroidism   . Hypothyroidism   . Urinary incontinence     Past Surgical History:  Procedure Laterality Date  . BREAST BIOPSY  2012  . CERVICAL ABLATION    . [place]  . CHOLECYSTECTOMY  2010  . COLONOSCOPY WITH PROPOFOL N/A 04/25/2018   Procedure: COLONOSCOPY WITH PROPOFOL;  Surgeon: Toledo, Benay Pike, MD;  Location: [facility] ENDOSCOPY;  Service:  Gastroenterology;  Laterality: N/A;  . KNEE ARTHROPLASTY Left 10/15/2019   Procedure: COMPUTER ASSISTED TOTAL KNEE ARTHROPLASTY;  Surgeon: Dereck Leep, MD;  Location: [facility] ORS;  Service: Orthopedics;  Laterality: Left;  . MENISCUS REPAIR Left 2012  . TONSILLECTOMY AND ADENOIDECTOMY  1960    Prior to Admission medications   Medication Sig Start Date End Date Taking? Authorizing Provider  acetaminophen (TYLENOL) 650 MG CR tablet Take 650 mg by mouth every 8 (eight) hours as needed for pain.  02/22/19   [provider]  Alpha-Lipoic Acid 300 MG CAPS Take 300 mg by mouth 3 (three) times a week.     [provider]  ARMOUR THYROID 60 MG tablet TAKE 1 TABLET DAILY Patient taking differently: Take 60 mg by mouth daily before breakfast.  05/17/14   Crecencio Mc, MD  Ascorbic Acid (VITAMIN C) 1000 MG tablet Take 1,000 mg by mouth daily.    [provider]  atenolol (TENORMIN) 25 MG tablet Take 25 mg by mouth at bedtime.  06/04/19 06/03/20  [provider]  calcium carbonate (TUMS - DOSED IN MG ELEMENTAL CALCIUM) 500 MG chewable tablet Chew 1 tablet by mouth as needed for indigestion or heartburn.    [provider]  celecoxib (CELEBREX) 200 MG capsule Take 1 capsule (200 mg total) by mouth 2 (two) times daily. 10/15/19 10/14/20  Hooten, Laurice Record, MD  Cholecalciferol (VITAMIN D) 2000 UNITS CAPS Take 2,000 Units by mouth 3 (three) times a week.     [provider]  Coenzyme Q10 (CO Q-10) 100 MG CAPS Take 100 mg by mouth 3 (three) times a week.     [provider]  DAYVIGO 10 MG TABS Take 1 tablet by mouth at bedtime. 01/11/20   [provider]  diclofenac Sodium (VOLTAREN) 1 % GEL Apply 1 application topically every evening.    [provider]  dicyclomine (BENTYL) 10 MG capsule Take 20 mg by mouth every 8 (eight) hours as needed. 01/28/20   [provider]  enoxaparin (LOVENOX) 40 MG/0.4ML injection Inject 0.4 mLs (40  mg total) into the skin daily. 10/15/19   Hooten, Laurice Record, MD  Eszopiclone 3 MG TABS Take 3 mg by mouth at bedtime. Take immediately before bedtime    [provider]  Evolocumab 140 MG/ML SOAJ Inject 140 mg into the skin every 14 (fourteen) days.  04/17/19   [provider]  Lactobacillus (PROBIOTIC ACIDOPHILUS) CAPS Take 1 capsule by mouth daily.     [provider]  Magnesium 250 MG TABS Take 250 mg by mouth at bedtime.    [provider]  Melatonin 5 MG TABS Take 5 mg by mouth at bedtime.    [provider]  meloxicam (MOBIC) 15 MG tablet Take 15 mg by mouth daily.  06/04/19   [provider]  OMEGA 3 1200 MG CAPS Take 1,200 mg by mouth 3 (three) times a week.     [provider]  omeprazole (PRILOSEC) 20 MG capsule Take 20 mg by mouth daily. 10/08/19   [provider]  oxyCODONE (ROXICODONE) 5 MG immediate release tablet Take 1-2 tablets (5-10 mg total) by mouth every 4 (four) hours as needed for severe pain. 10/15/19   Hooten, Laurice Record, MD  progesterone (PROMETRIUM) 100 MG capsule Take 100 mg by mouth at bedtime.  04/02/19   [provider]  progesterone (PROMETRIUM) 100 MG capsule Take 100 mg by mouth at bedtime. 02/19/20   [provider]  traMADol (ULTRAM) 50 MG tablet Take 1 tablet (50 mg total) by mouth every 6 (six) hours as needed for moderate pain. 10/15/19   Hooten, Laurice Record, MD  zinc gluconate 50 MG tablet Take 50 mg by mouth daily.    [provider]    Family History  Problem Relation Age of Onset  . Cancer Sister        breast  . Breast cancer Sister   . Cancer Sister        pancreatic  . Cancer Sister        cervical  . Mental illness Mother   . Alcohol abuse Mother   . Arthritis Mother   . Heart disease Father   . Hyperlipidemia Father   . Mental illness Maternal Aunt   . Arthritis Maternal Grandmother      Social History   Tobacco Use  . Smoking status: Never Smoker  .  Smokeless tobacco: Never Used  Substance Use Topics  . Alcohol use: Yes    Alcohol/week: 0.0 - 1.0 standard drinks    Comment: occassional  . Drug use: No    Allergies as of 02/20/2020  . (No Known Allergies)    Review of Systems:    All systems reviewed and negative except where noted in HPI.   Physical Exam:  There were no vitals taken for this visit. No LMP recorded. Patient is postmenopausal. General:   Alert,  Well-developed, well-nourished, pleasant and cooperative in NAD Head:  Normocephalic and atraumatic. Eyes:  Sclera clear, no icterus.   Conjunctiva pink. Ears:  Normal auditory acuity. Neck:  Supple; no masses or thyromegaly. Lungs:  Respirations even and unlabored.  Clear throughout to auscultation.   No wheezes, crackles, or rhonchi. No acute distress. Heart:  Regular rate and rhythm; no murmurs, clicks, rubs, or gallops. Abdomen:  Normal bowel sounds.  No bruits.  Soft, non-tender and non-distended without masses, hepatosplenomegaly or hernias noted.  No guarding or rebound tenderness.  Negative Carnett sign.   Rectal:  Deferred.  Pulses:  Normal pulses noted. Extremities:  No clubbing or edema.  No cyanosis. Neurologic:  Alert and oriented x3;  grossly normal neurologically. Skin:  Intact without significant lesions or rashes.  No jaundice. Lymph Nodes:  No significant cervical adenopathy. Psych:  Alert and cooperative. Normal mood and affect.  Imaging Studies: US BREAST LTD UNI LEFT INC AXILLA  Result Date: 01/30/2020 CLINICAL DATA:  64 year old female presenting for evaluation of pain in the upper-outer quadrant of the left breast. The patient has family history of breast cancer including her sister who was diagnosed in her 22s. Her mother had breast cancer in her 28s, and her maternal grandmother had ovarian cancer. She has  multiple other sisters with different other cancers including cervical and pancreatic). EXAM: DIGITAL DIAGNOSTIC LEFT MAMMOGRAM WITH CAD AND  TOMO ULTRASOUND LEFT BREAST COMPARISON:  Previous exam(s). ACR Breast Density Category c: The breast tissue is heterogeneously dense, which may obscure small masses. FINDINGS: No suspicious calcifications, masses or areas of distortion are seen in the left breast. Mammographic images were processed with CAD. Ultrasound targeted to the region of pain in the upper-outer left breast demonstrates normal dense fibroglandular tissue. No suspicious masses or areas of shadowing are identified. IMPRESSION: There are no mammographic or targeted sonographic abnormalities in the site of pain in the upper-outer left breast. RECOMMENDATION: 1. Return to routine screening mammography is recommended. The patient will be due for screening in July of 2021. 2. Consider genetics assessment to determine the patient's lifetime risk of breast cancer given her family history, if this has not already been performed. Per American Cancer Society guidelines, if the patient has a calculated lifetime risk of developing breast cancer of greater than 20%, annual screening MRI of the breasts would be recommended at the time of screening mammography. I have discussed the findings and recommendations with the patient. If applicable, a reminder letter will be sent to the patient regarding the next appointment. BI-RADS CATEGORY  1: Negative. Electronically Signed   By: Ammie Ferrier M.D.   On: 01/30/2020 13:42   MM DIAG BREAST TOMO UNI LEFT  Result Date: 01/30/2020 CLINICAL DATA:  64 year old female presenting for evaluation of pain in the upper-outer quadrant of the left breast. The patient has family history of breast cancer including her sister who was diagnosed in her 47s. Her mother had breast cancer in her 59s, and her maternal grandmother had ovarian cancer. She has multiple other sisters with different other cancers including cervical and pancreatic). EXAM: DIGITAL DIAGNOSTIC LEFT MAMMOGRAM WITH CAD AND TOMO ULTRASOUND LEFT BREAST  COMPARISON:  Previous exam(s). ACR Breast Density Category c: The breast tissue is heterogeneously dense, which may obscure small masses. FINDINGS: No suspicious calcifications, masses or areas of distortion are seen in the left breast. Mammographic images were processed with CAD. Ultrasound targeted to the region of pain in the upper-outer left breast demonstrates normal dense fibroglandular tissue. No suspicious masses or areas of shadowing are identified. IMPRESSION: There are no mammographic or targeted sonographic abnormalities in the site of pain in the upper-outer left breast. RECOMMENDATION: 1. Return to routine screening mammography is recommended. The patient will be due for screening in July of 2021. 2. Consider genetics assessment to determine the patient's lifetime risk of breast cancer given her family history, if this has not already been performed. Per American Cancer Society guidelines, if the patient has a calculated lifetime risk of developing breast cancer of greater than 20%, annual screening MRI of the breasts would be recommended at the time of screening mammography. I have discussed the findings and recommendations with the patient. If applicable, a reminder letter will be sent to the patient regarding the next appointment. BI-RADS CATEGORY  1: Negative. Electronically Signed   By: Ammie Ferrier M.D.   On: 01/30/2020 13:42    Assessment and Plan:   DYMONIQUE GILLEN is a 64 y.o. y/o female who comes in today after being seen by Dr. Alice Reichert in the past and at the [facility] since 2012.  She now comes to me and reports that " they just do my colonoscopies".  The patient now has bilateral flank pain that she states started after she started to  take NSAIDs for joint pain.  The patient now takes an over-the-counter medication for her heartburn which has calcium carbonate and simethicone needed.  She was on omeprazole in the past but was concerned about the side effects.  She reports  that as she has weaned herself off of the anti-inflammatory medications her symptoms have improved.  The patient has been told that Naprosyn can also cause GI upset and that she should try and stick with Tylenol if she can.  She has also been told that the Cox 2 inhibitors can also afford some GI protection.  The patient has also been reassured that her CT scan did not show any pancreatic cancers which is a concern of hers.  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  The patient was on a low-dose of dicyclomine which did not help but she had side effects.  The patient will be given a prescription for hyoscyamine to be taken sublingually as needed.  The patient will also be given samples of Dexilant to be taken for 2 weeks to help heal any damage from the NSAIDs.  The patient has been explained the plan and agrees with it.   Lucilla Lame, MD. Marval Regal    Note: This dictation was prepared with Dragon dictation along with smaller phrase technology. Any transcriptional errors that result from this process are unintentional.

## 2020-02-25 ENCOUNTER — Other Ambulatory Visit: Payer: Self-pay | Admitting: Nurse Practitioner

## 2020-02-25 ENCOUNTER — Other Ambulatory Visit: Payer: Self-pay

## 2020-02-25 DIAGNOSIS — Z1231 Encounter for screening mammogram for malignant neoplasm of breast: Secondary | ICD-10-CM

## 2020-02-25 MED ORDER — DEXLANSOPRAZOLE 60 MG PO CPDR: 60.0000 mg | DELAYED_RELEASE_CAPSULE | Freq: Every day | ORAL | 1 refills | Status: DC

## 2020-02-27 ENCOUNTER — Other Ambulatory Visit: Payer: Self-pay

## 2020-02-27 MED ORDER — PANTOPRAZOLE SODIUM 40 MG PO TBEC: 40.0000 mg | DELAYED_RELEASE_TABLET | Freq: Every day | ORAL | 3 refills | Status: DC

## 2020-03-07 ENCOUNTER — Encounter: Payer: Self-pay | Admitting: Nurse Practitioner

## 2020-03-13 ENCOUNTER — Encounter: Payer: Self-pay | Admitting: Nurse Practitioner

## 2020-04-02 ENCOUNTER — Other Ambulatory Visit: Payer: Self-pay

## 2020-04-02 ENCOUNTER — Ambulatory Visit
Admission: RE | Admit: 2020-04-02 | Discharge: 2020-04-02 | Disposition: A | Discharge status: Home / Self Care | Source: Ambulatory Visit | Attending: Nurse Practitioner | Admitting: Nurse Practitioner

## 2020-04-02 DIAGNOSIS — Z1231 Encounter for screening mammogram for malignant neoplasm of breast: Secondary | ICD-10-CM

## 2021-02-27 ENCOUNTER — Other Ambulatory Visit: Payer: Self-pay | Admitting: Nurse Practitioner

## 2021-02-27 DIAGNOSIS — Z1231 Encounter for screening mammogram for malignant neoplasm of breast: Secondary | ICD-10-CM

## 2021-03-29 NOTE — Discharge Instructions (Signed)
Instructions after Total Knee Replacement   Tyah Acord P. Brendalee Matthies, Jr., M.D.     Dept. of Orthopaedics & Sports Medicine  [facility]  [address]  [place], [place]  [address]  Phone: [phone]   Fax: [phone]    DIET: Drink plenty of non-alcoholic fluids. Resume your normal diet. Include foods high in fiber.  ACTIVITY:  You may use crutches or a walker with weight-bearing as tolerated, unless instructed otherwise. You may be weaned off of the walker or crutches by your Physical Therapist.  Do NOT place pillows under the knee. Anything placed under the knee could limit your ability to straighten the knee.   Continue doing gentle exercises. Exercising will reduce the pain and swelling, increase motion, and prevent muscle weakness.   Please continue to use the TED compression stockings for 6 weeks. You may remove the stockings at night, but should reapply them in the morning. Do not drive or operate any equipment until instructed.  WOUND CARE:  Continue to use the PolarCare or ice packs periodically to reduce pain and swelling. You may bathe or shower after the staples are removed at the first office visit following surgery.  MEDICATIONS: You may resume your regular medications. Please take the pain medication as prescribed on the medication. Do not take pain medication on an empty stomach. You have been given a prescription for a blood thinner (Lovenox or Coumadin). Please take the medication as instructed. (NOTE: After completing a 2 week course of Lovenox, take one Enteric-coated aspirin once a day. This along with elevation will help reduce the possibility of phlebitis in your operated leg.) Do not drive or drink alcoholic beverages when taking pain medications.  CALL THE OFFICE FOR: Temperature above 101 degrees Excessive bleeding or drainage on the dressing. Excessive swelling, coldness, or paleness of the toes. Persistent nausea and vomiting.  FOLLOW-UP:  You  should have an appointment to return to the office in 10-14 days after surgery. Arrangements have been made for continuation of Physical Therapy (either home therapy or outpatient therapy).   [facility] Department Directory         [email or web address]       [email or web address]          Cardiology  Appointments: [place] - [phone] [place] - [phone]  Endocrinology  Appointments: [place] - [phone] [place] - [phone]  Gastroenterology  Appointments: [place] - [phone] [place] - [phone]        General Surgery   Appointments: [place] - [phone]  Internal Medicine/Family Medicine  Appointments: [place] - [phone] [place] - [phone] [place] - [phone]  Metabolic and Weigh Loss Surgery  Appointments: [place] - [phone]        Neurology  Appointments: [place] - [phone] [place] - [phone]  Neurosurgery  Appointments: [place] - [phone]  Obstetrics & Gynecology  Appointments: [place] - [phone] [place] - [phone]        Pediatrics  Appointments: [place] - [phone] [place] - [phone]  Physiatry  Appointments: [place] -[phone]  Physical Therapy  Appointments: [place] - [phone] [place] - [phone]        Podiatry  Appointments: [place] - [phone] [place] - [phone]  Pulmonology  Appointments: [place] - [phone]  Rheumatology  Appointments: [place] - [phone]        [place] Location: [facility]  [address] [place], [place]  [address]  [place] Location: [facility] [address] [place], [place]  [address]  [place] Location: [facility] [address] [place], [place]  [address]    

## 2021-04-10 ENCOUNTER — Other Ambulatory Visit
Admission: RE | Admit: 2021-04-10 | Discharge: 2021-04-10 | Disposition: A | Discharge status: Home / Self Care | Source: Ambulatory Visit | Attending: Orthopedic Surgery | Admitting: Orthopedic Surgery

## 2021-04-10 ENCOUNTER — Other Ambulatory Visit: Payer: Self-pay

## 2021-04-10 DIAGNOSIS — Z01818 Encounter for other preprocedural examination: Secondary | ICD-10-CM | POA: Insufficient documentation

## 2021-04-10 HISTORY — DX: Dyspnea, unspecified: R06.00

## 2021-04-10 LAB — URINALYSIS, ROUTINE W REFLEX MICROSCOPIC
Bilirubin Urine: NEGATIVE
Glucose, UA: NEGATIVE mg/dL
Hgb urine dipstick: NEGATIVE
Ketones, ur: NEGATIVE mg/dL
Leukocytes,Ua: NEGATIVE
Nitrite: NEGATIVE
Protein, ur: NEGATIVE mg/dL
Specific Gravity, Urine: 1.004 — ABNORMAL LOW (ref 1.005–1.030)
pH: 6 (ref 5.0–8.0)

## 2021-04-10 LAB — COMPREHENSIVE METABOLIC PANEL
ALT: 22 U/L (ref 0–44)
AST: 24 U/L (ref 15–41)
Albumin: 4.2 g/dL (ref 3.5–5.0)
Alkaline Phosphatase: 49 U/L (ref 38–126)
Anion gap: 6 (ref 5–15)
BUN: 15 mg/dL (ref 8–23)
CO2: 28 mmol/L (ref 22–32)
Calcium: 9.2 mg/dL (ref 8.9–10.3)
Chloride: 106 mmol/L (ref 98–111)
Creatinine, Ser: 0.76 mg/dL (ref 0.44–1.00)
GFR, Estimated: >60 mL/min (ref >60)
Glucose, Bld: 94 mg/dL (ref 70–99)
Potassium: 3.5 mmol/L (ref 3.5–5.1)
Sodium: 140 mmol/L (ref 135–145)
Total Bilirubin: 0.7 mg/dL (ref 0.3–1.2)
Total Protein: 6.7 g/dL (ref 6.5–8.1)

## 2021-04-10 LAB — PROTIME-INR
INR: 1 (ref 0.8–1.2)
Prothrombin Time: 13.4 seconds (ref 11.4–15.2)

## 2021-04-10 LAB — CBC
HCT: 40.1 % (ref 36.0–46.0)
Hemoglobin: 13.1 g/dL (ref 12.0–15.0)
MCH: 31.7 pg (ref 26.0–34.0)
MCHC: 32.7 g/dL (ref 30.0–36.0)
MCV: 97.1 fL (ref 80.0–100.0)
Platelets: 158 10*3/uL (ref 150–400)
RBC: 4.13 MIL/uL (ref 3.87–5.11)
RDW: 13.5 % (ref 11.5–15.5)
WBC: 4.6 10*3/uL (ref 4.0–10.5)
nRBC: 0 % (ref 0.0–0.2)

## 2021-04-10 LAB — TYPE AND SCREEN
ABO/RH(D): A POS
Antibody Screen: NEGATIVE

## 2021-04-10 LAB — APTT: aPTT: 28 seconds (ref 24–36)

## 2021-04-10 LAB — C-REACTIVE PROTEIN: CRP: 1.1 mg/dL — ABNORMAL HIGH

## 2021-04-10 LAB — SEDIMENTATION RATE: Sed Rate: 6 mm/hr (ref 0–30)

## 2021-04-10 LAB — SURGICAL PCR SCREEN
MRSA, PCR: NEGATIVE
Staphylococcus aureus: NEGATIVE

## 2021-04-10 NOTE — Patient Instructions (Addendum)
Your procedure is scheduled on: Monday April 20, 2021. Report to Day Surgery inside [facility] 2nd floor stop by admissions desk first before getting on elevator. To find out your arrival time please call [phone] between 1PM - 3PM on Friday April 17, 2021.  Remember: Instructions that are not followed completely may result in serious medical risk,  up to and including death, or upon the discretion of your surgeon and anesthesiologist your  surgery may need to be rescheduled.     _X__ 1. Do not eat food after midnight the night before your procedure.                 No chewing gum or hard candies. You may drink clear liquids up to 2 hours                 before you are scheduled to arrive for your surgery- DO not drink clear                 liquids within 2 hours of the start of your surgery.                 Clear Liquids include:  water, apple juice without pulp, clear Gatorade, G2 or                  Gatorade Zero (avoid Red/Purple/Blue), Black Coffee or Tea (Do not add                 anything to coffee or tea).  __X__2.   Complete the "Ensure Clear Pre-surgery Clear Carbohydrate Drink" provided to you, 2 hours before arrival. **If you are diabetic you will be provided with an alternative drink, Gatorade Zero or G2.  __X__3.  On the morning of surgery brush your teeth with toothpaste and water, you                may rinse your mouth with mouthwash if you wish.  Do not swallow any toothpaste of mouthwash.     _X__ 4.  No Alcohol for 24 hours before or after surgery.   _X__ 5.  Do Not Smoke or use e-cigarettes For 24 Hours Prior to Your Surgery.                 Do not use any chewable tobacco products for at least 6 hours prior to                 Surgery.  _X__  6.  Do not use any recreational drugs (marijuana, cocaine, heroin, ecstasy, MDMA or other)                For at least one week prior to your surgery.  Combination of these drugs with anesthesia                 May have life threatening results.  __X__  7.  Notify your doctor if there is any change in your medical condition      (cold, fever, infections).     Do not wear jewelry, make-up, hairpins, clips or nail polish. Do not wear lotions, powders, or perfumes. You may wear deodorant. Do not shave 48 hours prior to surgery. Men may shave face and neck. Do not bring valuables to the hospital.    [facility] is not responsible for any belongings or valuables.  Contacts, dentures or bridgework may not be worn into surgery. Leave your suitcase in the car. After  surgery it may be brought to your room. For patients admitted to the hospital, discharge time is determined by your treatment team.   Patients discharged the day of surgery will not be allowed to drive home.   Make arrangements for someone to be with you for the first 24 hours of your Same Day Discharge.   __X__ Take these medicines the morning of surgery with A SIP OF WATER:    1. ARMOUR THYROID 60 MG  2. pantoprazole (PROTONIX) 40 MG  3.  4.  5.  ____ Fleet Enema (as directed)   __X__ Use CHG Soap (or wipes) as directed  ____ Use Benzoyl Peroxide Gel as instructed  ____ Use inhalers on the day of surgery  ____ Stop metformin 2 days prior to surgery    ____ Take 1/2 of usual insulin dose the night before surgery. No insulin the morning          of surgery.   ____ Call your PCP, cardiologist, or Pulmonologist if taking Coumadin/Plavix/aspirin and ask when to stop before your surgery.   __X__ One Week prior to surgery- Stop Anti-inflammatories such as Ibuprofen, Aleve, Advil, Motrin, meloxicam (MOBIC), diclofenac, etodolac, ketorolac, Toradol, Daypro, piroxicam, Goody's or BC powders. OK TO USE TYLENOL IF NEEDED   __X__ One week prior to surgery - Stop all supplements until after surgery.    ____ Bring C-Pap to the hospital.    If you have any questions regarding your pre-procedure instructions,  Please  call Pre-admit Testing at [phone].

## 2021-04-12 LAB — URINE CULTURE
Culture: NO GROWTH
Special Requests: NORMAL

## 2021-04-16 ENCOUNTER — Other Ambulatory Visit
Admission: RE | Admit: 2021-04-16 | Discharge: 2021-04-16 | Disposition: A | Discharge status: Home / Self Care | Source: Ambulatory Visit | Attending: Orthopedic Surgery | Admitting: Orthopedic Surgery

## 2021-04-16 ENCOUNTER — Other Ambulatory Visit: Payer: Self-pay

## 2021-04-16 DIAGNOSIS — Z20822 Contact with and (suspected) exposure to covid-19: Secondary | ICD-10-CM | POA: Insufficient documentation

## 2021-04-16 DIAGNOSIS — Z01812 Encounter for preprocedural laboratory examination: Secondary | ICD-10-CM | POA: Insufficient documentation

## 2021-04-16 LAB — SARS CORONAVIRUS 2 (TAT 6-24 HRS): SARS Coronavirus 2: NEGATIVE

## 2021-04-19 ENCOUNTER — Encounter: Payer: Self-pay | Admitting: Orthopedic Surgery

## 2021-04-19 NOTE — H&P (Signed)
ORTHOPAEDIC HISTORY & PHYSICAL [name], [place] - 04/09/2021 8:30 AM EDT Formatting of this note is different from the original. [place] Chief Complaint:   Chief Complaint  Patient presents with   Knee Pain  H & P RIGHT KNEE   History of Present Illness:   Kelly Vaughn is a 65 y.o. female that presents to clinic today for her preoperative history and evaluation. Patient presents unaccompanied. The patient is scheduled to undergo a right total knee arthroplasty on 04/20/21 by Dr. Marry Guan. Her pain began several years ago. The pain is located primarily along the medial aspect of the knee. She describes her pain as worse with weightbearing. She reports associated swelling with some giving way of the knee. She denies associated numbness or tingling, denies locking.   The patient's symptoms have progressed to the point that they decrease her quality of life. The patient has previously undergone conservative treatment including NSAIDS and injections to the knee without adequate control of her symptoms.  Patient denies history of blood clots, lumbar surgery. Patient previously underwent left total knee arthroplasty on 10/15/2019.  Patient is not diabetic.  Past Medical, Surgical, Family, Social History, Allergies, Medications:   Past Medical History:  Past Medical History:  Diagnosis Date   Anemia, iron deficiency   Bipolar affective disorder (CMS-HCC)  with Mania   Depression   Eating disorder   GERD (gastroesophageal reflux disease)   Hx of colonic polyps   Hyperlipidemia   Hypothyroidism (acquired), unspecified   Left knee DJD   Memory loss   Menopausal syndrome   Migraine headache   Skin cancer   Thyroid disease   Urinary incontinence   Vitamin B12 deficiency   Past Surgical History:  Past Surgical History:  Procedure Laterality Date   BREAST EXCISIONAL BIOPSY   CESAREAN SECTION   CHOLECYSTECTOMY   COLONOSCOPY  11/12/2010  @ [facility] - Nml per pt. PHPolyps 2008, FHPolyps, rec. req.   COLONOSCOPY 04/25/2018  Entire examined colon is normal/FHx CP/PHx CP/Repeat 11yr/TKT   LASER ABLATION CONDYLOMA CERVICAL / VULVAR   Left knee arthroscopy, medial meniscectomy, and chondroplasty 01/03/2013  Dr. HMarry Guan  Left total knee arthroplasty using computer-assisted navigation 10/15/2019  Dr HMarry Guan  TONSILLECTOMY AND ADENOIDECTOMY   Current Medications:  Current Outpatient Medications  Medication Sig Dispense Refill   5-hydroxytryptophan, 5-HTP, 100 mg Cap Take 100 mg by mouth once daily   acetaminophen (TYLENOL) 650 MG ER tablet Take 650 mg by mouth nightly   alpha lipoic acid 200 mg Cap Take 1 capsule by mouth once daily   amoxicillin (AMOXIL) 500 MG capsule Take 500 mg by mouth FOUR CAPS FOUR DAYS PRIOR TO DENTAL APPOINTMENT   atenoloL (TENORMIN) 25 MG tablet Take 0.5 tablets (12.5 mg total) by mouth once daily 45 tablet 3   B.animalis,bifid,infantis,long 10-15 mg TbEC Take 1 capsule by mouth once daily   carboxymethylcellulose-glycerin (REFRESH OPTIVE) 0.5-0.9 % ophthalmic solution Place 1 drop into both eyes as needed   celecoxib (CELEBREX) 200 MG capsule TAKE 1 CAPSULE TWICE A DAY (Patient taking differently: Take 200 mg by mouth 2 (two) times daily as needed) 180 capsule 3   co-enzyme Q-10, ubiquinone, (CO Q-10) 100 mg capsule Take 100 mg by mouth once daily   diclofenac (VOLTAREN) 1 % topical gel Apply 2 g topically 4 (four) times daily   eszopiclone (LUNESTA) 3 mg tablet Take 3 mg by mouth nightly Take immediately before bedtime.   evening  primrose oil 500 mg Cap Take 1,500 mg by mouth once daily   gabapentin (NEURONTIN) 300 MG capsule Take 300 mg by mouth nightly   Lactobacillus acidophilus (PROBIOTIC) 10 billion cell Cap Take 1 tablet by mouth once daily   lutein 20 mg Tab Take 20 mg by mouth once daily   MILK THISTLE ORAL Take 100 mg by mouth once daily   miscellaneous medical supply Misc Take 3  each by mouth nightly MAG BLUE   miscellaneous medical supply Misc Apply topically once daily as needed AMISH PAIN RELIEF CREAM   miscellaneous medical supply Misc Use 1 each once daily L - THEARNINE   miscellaneous medical supply Misc Use 3 each once daily NORDIC IMMUNE   multivitamin with minerals tablet Take 3 tablets by mouth once daily   predniSONE (DELTASONE) 10 MG tablet Take 10 mg by mouth once daily   progesterone (PROMETRIUM) 100 MG capsule Take 100 mg by mouth once daily   REPATHA SURECLICK [id number] mg/mL PnIj INJECT 140 MG UNDER THE SKIN EVERY 14 DAYS 6 mL 3   thyroid (ARMOUR) 60 mg tablet TAKE 1 TABLET DAILY   zolpidem (AMBIEN) 5 MG tablet Take 5 mg by mouth nightly as needed   No current facility-administered medications for this visit.   Allergies:  Allergies  Allergen Reactions   Dicyclomine Hcl Other (See Comments)  Dizziness and blurred vision   Social History:  Social History   Socioeconomic History   Marital status: Married  Spouse name: Richard   Number of children: 2   Years of education: 22  Occupational History   Occupation: Retired Chief Technology Officer  Tobacco Use   Smoking status: Never Smoker   Smokeless tobacco: Never Used  Scientific laboratory technician Use: Never used  Substance and Sexual Activity   Alcohol use: Yes  Comment: social drinker   Drug use: No   Sexual activity: Yes  Partners: Male  Birth control/protection: Post-menopausal   Family History:  Family History  Problem Relation Age of Onset   Dementia Mother   Alcohol abuse Mother   Arthritis Mother   Mental illness Mother   Colon polyps Mother   Osteoporosis (Thinning of bones) Mother   Heart disease Father   Hyperlipidemia (Elevated cholesterol) Father   Cancer Father   Osteoporosis (Thinning of bones) Father   Breast cancer Sister   Hypothyroidism Sister   Pancreatic cancer Sister   Hypothyroidism Sister   Cervical cancer Sister   Hypothyroidism Sister   Colon polyps  Paternal Grandmother   Pancreatic cancer Sister   Hypothyroidism Sister   Review of Systems:   A 10+ ROS was performed, reviewed, and the pertinent orthopaedic findings are documented in the HPI.   Physical Examination:   BP 120/76 (BP Location: Left upper arm, Patient Position: Sitting, BP Cuff Size: Adult)  Ht 170.2 cm ('5\' 7"'$ )  Wt 83.2 kg (183 lb 6.4 oz)  BMI 28.72 kg/m   Patient is a well-developed, well-nourished female in no acute distress. Patient has normal mood and affect. Patient is alert and oriented to person, place, and time.   HEENT: Atraumatic, normocephalic. Pupils equal and reactive to light. Extraocular motion intact. Noninjected sclera.  Cardiovascular: Regular rate and rhythm, with no murmurs, rubs, or gallops. Distal pulses palpable.  Respiratory: Lungs clear to auscultation bilaterally.   Right Knee:  Soft tissue swelling: minimal Effusion: none Erythema: none Crepitance: minimal Tenderness: medial joint line Alignment: relative varus Mediolateral laxity: medial pseudolaxity Anterior drawer test:negative  Lachman`s test: negative McMurray`s test: negative Atrophy: No significant atrophy.  Quadriceps tone was fair to good. Range of Motion: 0/0/130 degrees   Sensation intact over the saphenous, lateral sural cutaneous, superficial fibular, and deep fibular nerve distributions.  Tests Performed/Reviewed:  X-rays  X-ray knee right 3 views  Result Date: 04/09/2021 3 views of the right knee were obtained. Images reveal severe loss of medial compartment joint space with significant osteophyte formation. No fractures or dislocations. No osseous abnormality noted.   Impression:   ICD-10-CM  1. Primary osteoarthritis of right knee M17.11   Plan:   The patient has end-stage degenerative changes of the right knee. It was explained to the patient that the condition is progressive in nature. Having failed conservative treatment, the patient has elected to  proceed with a total joint arthroplasty. The patient will undergo a total joint arthroplasty with Dr. Marry Guan. The risks of surgery, including blood clot and infection, were discussed with the patient. Measures to reduce these risks, including the use of anticoagulation, perioperative antibiotics, and early ambulation were discussed. The importance of postoperative physical therapy was discussed with the patient. The patient elects to proceed with surgery. The patient is instructed to stop all blood thinners prior to surgery. The patient is instructed to call the hospital the day before surgery to learn of the proper arrival time.   Contact our office with any questions or concerns. Follow up as indicated, or sooner should any new problems arise, if conditions worsen, or if they are otherwise concerned.   [name], [facility] [place], [place] [address] Phone: [phone]  This note was generated in part with voice recognition software and I apologize for any typographical errors that were not detected and corrected.  Electronically signed by [name], PA at 04/09/2021 8:51 AM EDT

## 2021-04-20 ENCOUNTER — Inpatient Hospital Stay
Admission: RE | Admit: 2021-04-20 | Discharge: 2021-04-21 | DRG: 470 | Disposition: A | Discharge status: Home / Self Care | Attending: Orthopedic Surgery | Admitting: Orthopedic Surgery

## 2021-04-20 ENCOUNTER — Other Ambulatory Visit: Payer: Self-pay

## 2021-04-20 ENCOUNTER — Encounter: Payer: Self-pay | Admitting: Orthopedic Surgery

## 2021-04-20 ENCOUNTER — Inpatient Hospital Stay: Admitting: Anesthesiology

## 2021-04-20 ENCOUNTER — Encounter
Admission: RE | Disposition: A | Discharge status: Home / Self Care | Payer: Self-pay | Source: Home / Self Care | Attending: Orthopedic Surgery

## 2021-04-20 ENCOUNTER — Inpatient Hospital Stay

## 2021-04-20 DIAGNOSIS — Z85828 Personal history of other malignant neoplasm of skin: Secondary | ICD-10-CM | POA: Diagnosis not present

## 2021-04-20 DIAGNOSIS — G4730 Sleep apnea, unspecified: Secondary | ICD-10-CM | POA: Diagnosis present

## 2021-04-20 DIAGNOSIS — Z800 Family history of malignant neoplasm of digestive organs: Secondary | ICD-10-CM | POA: Diagnosis not present

## 2021-04-20 DIAGNOSIS — Z79890 Hormone replacement therapy (postmenopausal): Secondary | ICD-10-CM

## 2021-04-20 DIAGNOSIS — Z79899 Other long term (current) drug therapy: Secondary | ICD-10-CM | POA: Diagnosis not present

## 2021-04-20 DIAGNOSIS — Z8049 Family history of malignant neoplasm of other genital organs: Secondary | ICD-10-CM | POA: Diagnosis not present

## 2021-04-20 DIAGNOSIS — Z8719 Personal history of other diseases of the digestive system: Secondary | ICD-10-CM | POA: Diagnosis not present

## 2021-04-20 DIAGNOSIS — M1711 Unilateral primary osteoarthritis, right knee: Principal | ICD-10-CM | POA: Diagnosis present

## 2021-04-20 DIAGNOSIS — Z7952 Long term (current) use of systemic steroids: Secondary | ICD-10-CM

## 2021-04-20 DIAGNOSIS — Z8261 Family history of arthritis: Secondary | ICD-10-CM | POA: Diagnosis not present

## 2021-04-20 DIAGNOSIS — E039 Hypothyroidism, unspecified: Secondary | ICD-10-CM | POA: Diagnosis present

## 2021-04-20 DIAGNOSIS — Z803 Family history of malignant neoplasm of breast: Secondary | ICD-10-CM

## 2021-04-20 DIAGNOSIS — Z96652 Presence of left artificial knee joint: Secondary | ICD-10-CM | POA: Diagnosis present

## 2021-04-20 DIAGNOSIS — Z8262 Family history of osteoporosis: Secondary | ICD-10-CM

## 2021-04-20 DIAGNOSIS — Z96659 Presence of unspecified artificial knee joint: Secondary | ICD-10-CM

## 2021-04-20 DIAGNOSIS — Z8371 Family history of colonic polyps: Secondary | ICD-10-CM

## 2021-04-20 DIAGNOSIS — Z8249 Family history of ischemic heart disease and other diseases of the circulatory system: Secondary | ICD-10-CM | POA: Diagnosis not present

## 2021-04-20 HISTORY — PX: KNEE ARTHROPLASTY: SHX992

## 2021-04-20 SURGERY — ARTHROPLASTY, KNEE, TOTAL, USING IMAGELESS COMPUTER-ASSISTED NAVIGATION
Anesthesia: Spinal | Site: Knee | Laterality: Right

## 2021-04-20 MED ORDER — NON FORMULARY: 3.0000 mg | Freq: Every day | Status: DC

## 2021-04-20 MED ORDER — PHENOL 1.4 % MT LIQD
1.0000 | OROMUCOSAL | Status: DC | PRN
  Filled 2021-04-20: qty 177

## 2021-04-20 MED ORDER — GABAPENTIN 300 MG PO CAPS
ORAL_CAPSULE | ORAL | Status: AC
  Administered 2021-04-20: 300 mg via ORAL
  Filled 2021-04-20: qty 1

## 2021-04-20 MED ORDER — POLYVINYL ALCOHOL 1.4 % OP SOLN
1.0000 [drp] | OPHTHALMIC | Status: DC | PRN
  Filled 2021-04-20: qty 15

## 2021-04-20 MED ORDER — ORAL CARE MOUTH RINSE: 15.0000 mL | Freq: Once | OROMUCOSAL | Status: AC

## 2021-04-20 MED ORDER — TRANEXAMIC ACID-NACL 1000-0.7 MG/100ML-% IV SOLN: 1000.0000 mg | Freq: Once | INTRAVENOUS | Status: AC

## 2021-04-20 MED ORDER — PROBIOTIC ACIDOPHILUS PO CAPS: 1.0000 | ORAL_CAPSULE | Freq: Every day | ORAL | Status: DC

## 2021-04-20 MED ORDER — TRANEXAMIC ACID-NACL 1000-0.7 MG/100ML-% IV SOLN
INTRAVENOUS | Status: DC | PRN
  Administered 2021-04-20: 1000 mg via INTRAVENOUS

## 2021-04-20 MED ORDER — CELECOXIB 200 MG PO CAPS
ORAL_CAPSULE | ORAL | Status: AC
  Administered 2021-04-20: 400 mg via ORAL
  Filled 2021-04-20: qty 2

## 2021-04-20 MED ORDER — ALUM & MAG HYDROXIDE-SIMETH 200-200-20 MG/5ML PO SUSP: 30.0000 mL | ORAL | Status: DC | PRN

## 2021-04-20 MED ORDER — ENOXAPARIN SODIUM 30 MG/0.3ML IJ SOSY
30.0000 mg | PREFILLED_SYRINGE | Freq: Two times a day (BID) | INTRAMUSCULAR | Status: DC
  Administered 2021-04-21: 30 mg via SUBCUTANEOUS
  Filled 2021-04-20: qty 0.3

## 2021-04-20 MED ORDER — SODIUM CHLORIDE 0.9 % IR SOLN
Status: DC | PRN
  Administered 2021-04-20: 3000 mL

## 2021-04-20 MED ORDER — MAGNESIUM HYDROXIDE 400 MG/5ML PO SUSP
30.0000 mL | Freq: Every day | ORAL | Status: DC
  Administered 2021-04-20 – 2021-04-21 (×2): 30 mL via ORAL
  Filled 2021-04-20 (×2): qty 30

## 2021-04-20 MED ORDER — MENTHOL 3 MG MT LOZG
1.0000 | LOZENGE | OROMUCOSAL | Status: DC | PRN
  Filled 2021-04-20: qty 9

## 2021-04-20 MED ORDER — ADULT MULTIVITAMIN W/MINERALS CH
3.0000 | ORAL_TABLET | Freq: Every day | ORAL | Status: DC
  Administered 2021-04-21: 3 via ORAL
  Filled 2021-04-20 (×2): qty 3

## 2021-04-20 MED ORDER — BUPIVACAINE HCL (PF) 0.25 % IJ SOLN
INTRAMUSCULAR | Status: AC
  Filled 2021-04-20: qty 60

## 2021-04-20 MED ORDER — PHENYLEPHRINE HCL (PRESSORS) 10 MG/ML IV SOLN
INTRAVENOUS | Status: AC
  Filled 2021-04-20: qty 1

## 2021-04-20 MED ORDER — CEFAZOLIN SODIUM-DEXTROSE 2-4 GM/100ML-% IV SOLN
INTRAVENOUS | Status: AC
  Administered 2021-04-20: 2000 mg
  Filled 2021-04-20: qty 100

## 2021-04-20 MED ORDER — PROMETHAZINE HCL 25 MG/ML IJ SOLN: 6.2500 mg | INTRAMUSCULAR | Status: DC | PRN

## 2021-04-20 MED ORDER — DEXAMETHASONE SODIUM PHOSPHATE 10 MG/ML IJ SOLN
INTRAMUSCULAR | Status: AC
  Administered 2021-04-20: 8 mg via INTRAVENOUS
  Filled 2021-04-20: qty 1

## 2021-04-20 MED ORDER — ACETAMINOPHEN 10 MG/ML IV SOLN
1000.0000 mg | Freq: Four times a day (QID) | INTRAVENOUS | Status: AC
  Administered 2021-04-20 – 2021-04-21 (×3): 1000 mg via INTRAVENOUS
  Filled 2021-04-20 (×4): qty 100

## 2021-04-20 MED ORDER — SODIUM CHLORIDE 0.9 % IJ SOLN
INTRAMUSCULAR | Status: DC | PRN
  Administered 2021-04-20: 120 mL

## 2021-04-20 MED ORDER — GABAPENTIN 300 MG PO CAPS: 300.0000 mg | ORAL_CAPSULE | Freq: Once | ORAL | Status: AC

## 2021-04-20 MED ORDER — PROPOFOL 10 MG/ML IV BOLUS
INTRAVENOUS | Status: DC | PRN
  Administered 2021-04-20: 20 mg via INTRAVENOUS

## 2021-04-20 MED ORDER — HYDROMORPHONE HCL 1 MG/ML IJ SOLN: 0.5000 mg | INTRAMUSCULAR | Status: DC | PRN

## 2021-04-20 MED ORDER — ONDANSETRON HCL 4 MG/2ML IJ SOLN: 4.0000 mg | Freq: Four times a day (QID) | INTRAMUSCULAR | Status: DC | PRN

## 2021-04-20 MED ORDER — BUPIVACAINE HCL (PF) 0.5 % IJ SOLN
INTRAMUSCULAR | Status: DC | PRN
  Administered 2021-04-20: 3 mL

## 2021-04-20 MED ORDER — METOCLOPRAMIDE HCL 10 MG PO TABS
10.0000 mg | ORAL_TABLET | Freq: Three times a day (TID) | ORAL | Status: DC
  Administered 2021-04-20 – 2021-04-21 (×4): 10 mg via ORAL
  Filled 2021-04-20 (×4): qty 1

## 2021-04-20 MED ORDER — PROPOFOL 1000 MG/100ML IV EMUL
INTRAVENOUS | Status: AC
  Filled 2021-04-20: qty 100

## 2021-04-20 MED ORDER — SODIUM CHLORIDE FLUSH 0.9 % IV SOLN
INTRAVENOUS | Status: AC
  Filled 2021-04-20: qty 40

## 2021-04-20 MED ORDER — SIMETHICONE 80 MG PO CHEW
80.0000 mg | CHEWABLE_TABLET | Freq: Four times a day (QID) | ORAL | Status: DC | PRN
Start: 2021-04-20 — End: 2021-04-21
  Filled 2021-04-20: qty 1

## 2021-04-20 MED ORDER — MIDAZOLAM HCL 2 MG/2ML IJ SOLN
INTRAMUSCULAR | Status: AC
  Filled 2021-04-20: qty 2

## 2021-04-20 MED ORDER — CELECOXIB 200 MG PO CAPS
200.0000 mg | ORAL_CAPSULE | Freq: Two times a day (BID) | ORAL | Status: DC
  Administered 2021-04-20 – 2021-04-21 (×2): 200 mg via ORAL
  Filled 2021-04-20 (×2): qty 1

## 2021-04-20 MED ORDER — LACTATED RINGERS IV SOLN: INTRAVENOUS | Status: DC

## 2021-04-20 MED ORDER — PROPOFOL 500 MG/50ML IV EMUL
INTRAVENOUS | Status: DC | PRN
  Administered 2021-04-20: 200 ug/kg/min via INTRAVENOUS

## 2021-04-20 MED ORDER — CALCIUM CARBONATE ANTACID 500 MG PO CHEW: 1500.0000 mg | CHEWABLE_TABLET | Freq: Every day | ORAL | Status: DC | PRN

## 2021-04-20 MED ORDER — SURGIPHOR WOUND IRRIGATION SYSTEM - OPTIME
TOPICAL | Status: DC | PRN
  Administered 2021-04-20: 400 mL via TOPICAL

## 2021-04-20 MED ORDER — FLEET ENEMA 7-19 GM/118ML RE ENEM: 1.0000 | ENEMA | Freq: Once | RECTAL | Status: DC | PRN

## 2021-04-20 MED ORDER — ACETAMINOPHEN 10 MG/ML IV SOLN
INTRAVENOUS | Status: AC
  Filled 2021-04-20: qty 100

## 2021-04-20 MED ORDER — SODIUM CHLORIDE 0.9 % IR SOLN
Status: DC | PRN
  Administered 2021-04-20: 100 mL

## 2021-04-20 MED ORDER — EPHEDRINE 5 MG/ML INJ
INTRAVENOUS | Status: AC
  Filled 2021-04-20: qty 10

## 2021-04-20 MED ORDER — ACETAMINOPHEN 325 MG PO TABS: 325.0000 mg | ORAL_TABLET | Freq: Four times a day (QID) | ORAL | Status: DC | PRN

## 2021-04-20 MED ORDER — SODIUM CHLORIDE 0.9 % IV SOLN: INTRAVENOUS | Status: DC

## 2021-04-20 MED ORDER — FERROUS SULFATE 325 (65 FE) MG PO TABS
325.0000 mg | ORAL_TABLET | Freq: Two times a day (BID) | ORAL | Status: DC
  Administered 2021-04-20 – 2021-04-21 (×2): 325 mg via ORAL
  Filled 2021-04-20 (×2): qty 1

## 2021-04-20 MED ORDER — SODIUM CHLORIDE 0.9 % IV SOLN
INTRAVENOUS | Status: DC | PRN
  Administered 2021-04-20: 50 ug/min via INTRAVENOUS

## 2021-04-20 MED ORDER — OXYCODONE HCL 5 MG PO TABS
10.0000 mg | ORAL_TABLET | ORAL | Status: DC | PRN
  Filled 2021-04-20: qty 2

## 2021-04-20 MED ORDER — ONDANSETRON HCL 4 MG/2ML IJ SOLN
INTRAMUSCULAR | Status: DC | PRN
  Administered 2021-04-20: 4 mg via INTRAVENOUS

## 2021-04-20 MED ORDER — ZOLPIDEM TARTRATE 5 MG PO TABS
5.0000 mg | ORAL_TABLET | Freq: Every evening | ORAL | Status: DC | PRN
  Administered 2021-04-20: 5 mg via ORAL
  Filled 2021-04-20: qty 1

## 2021-04-20 MED ORDER — CEFAZOLIN SODIUM-DEXTROSE 2-4 GM/100ML-% IV SOLN
2.0000 g | INTRAVENOUS | Status: AC
  Administered 2021-04-20: 2 g via INTRAVENOUS

## 2021-04-20 MED ORDER — BUPIVACAINE LIPOSOME 1.3 % IJ SUSP
INTRAMUSCULAR | Status: AC
  Filled 2021-04-20: qty 20

## 2021-04-20 MED ORDER — OXYCODONE HCL 5 MG PO TABS
5.0000 mg | ORAL_TABLET | ORAL | Status: DC | PRN
  Administered 2021-04-20 (×2): 5 mg via ORAL
  Filled 2021-04-20: qty 1

## 2021-04-20 MED ORDER — ONDANSETRON HCL 4 MG/2ML IJ SOLN
INTRAMUSCULAR | Status: AC
  Filled 2021-04-20: qty 2

## 2021-04-20 MED ORDER — DIPHENHYDRAMINE HCL 12.5 MG/5ML PO ELIX: 12.5000 mg | ORAL_SOLUTION | ORAL | Status: DC | PRN

## 2021-04-20 MED ORDER — TRANEXAMIC ACID-NACL 1000-0.7 MG/100ML-% IV SOLN
INTRAVENOUS | Status: AC
  Filled 2021-04-20: qty 100

## 2021-04-20 MED ORDER — CHLORHEXIDINE GLUCONATE 0.12 % MT SOLN
OROMUCOSAL | Status: AC
  Administered 2021-04-20: 15 mL via OROMUCOSAL
  Filled 2021-04-20: qty 15

## 2021-04-20 MED ORDER — CARBOXYMETHYLCELLUL-GLYCERIN 0.5-0.9 % OP SOLN: 1.0000 [drp] | Freq: Four times a day (QID) | OPHTHALMIC | Status: DC | PRN

## 2021-04-20 MED ORDER — ACETAMINOPHEN 10 MG/ML IV SOLN
INTRAVENOUS | Status: DC | PRN
  Administered 2021-04-20: 1000 mg via INTRAVENOUS

## 2021-04-20 MED ORDER — RISAQUAD PO CAPS
1.0000 | ORAL_CAPSULE | Freq: Every day | ORAL | Status: DC
  Administered 2021-04-21: 1 via ORAL
  Filled 2021-04-20: qty 1

## 2021-04-20 MED ORDER — NEOMYCIN-POLYMYXIN B GU 40-200000 IR SOLN
Status: AC
  Filled 2021-04-20: qty 40

## 2021-04-20 MED ORDER — TRANEXAMIC ACID-NACL 1000-0.7 MG/100ML-% IV SOLN: 1000.0000 mg | INTRAVENOUS | Status: DC

## 2021-04-20 MED ORDER — TRAMADOL HCL 50 MG PO TABS
50.0000 mg | ORAL_TABLET | ORAL | Status: DC | PRN
  Administered 2021-04-20 – 2021-04-21 (×2): 50 mg via ORAL
  Administered 2021-04-21: 100 mg via ORAL
  Filled 2021-04-20: qty 2
  Filled 2021-04-20 (×2): qty 1

## 2021-04-20 MED ORDER — PANTOPRAZOLE SODIUM 40 MG PO TBEC
40.0000 mg | DELAYED_RELEASE_TABLET | Freq: Two times a day (BID) | ORAL | Status: DC
  Administered 2021-04-20 – 2021-04-21 (×2): 40 mg via ORAL
  Filled 2021-04-20 (×3): qty 1

## 2021-04-20 MED ORDER — VITAMIN D 25 MCG (1000 UNIT) PO TABS
1000.0000 [IU] | ORAL_TABLET | Freq: Every day | ORAL | Status: DC
  Administered 2021-04-21: 1000 [IU] via ORAL
  Filled 2021-04-20: qty 1

## 2021-04-20 MED ORDER — CEFAZOLIN SODIUM-DEXTROSE 2-4 GM/100ML-% IV SOLN
2.0000 g | Freq: Four times a day (QID) | INTRAVENOUS | Status: AC
Start: 2021-04-20 — End: 2021-04-20
  Administered 2021-04-20: 2 g via INTRAVENOUS
  Filled 2021-04-20 (×2): qty 100

## 2021-04-20 MED ORDER — SENNOSIDES-DOCUSATE SODIUM 8.6-50 MG PO TABS
1.0000 | ORAL_TABLET | Freq: Two times a day (BID) | ORAL | Status: DC
  Administered 2021-04-20 – 2021-04-21 (×2): 1 via ORAL
  Filled 2021-04-20 (×2): qty 1

## 2021-04-20 MED ORDER — CHLORHEXIDINE GLUCONATE 4 % EX LIQD
60.0000 mL | Freq: Once | CUTANEOUS | Status: AC
  Administered 2021-04-20: 4 via TOPICAL

## 2021-04-20 MED ORDER — CELECOXIB 200 MG PO CAPS: 400.0000 mg | ORAL_CAPSULE | Freq: Once | ORAL | Status: AC

## 2021-04-20 MED ORDER — EPHEDRINE SULFATE 50 MG/ML IJ SOLN
INTRAMUSCULAR | Status: DC | PRN
  Administered 2021-04-20 (×5): 5 mg via INTRAVENOUS

## 2021-04-20 MED ORDER — THYROID 60 MG PO TABS
60.0000 mg | ORAL_TABLET | Freq: Every day | ORAL | Status: DC
  Administered 2021-04-21: 60 mg via ORAL
  Filled 2021-04-20: qty 1

## 2021-04-20 MED ORDER — ENSURE PRE-SURGERY PO LIQD
296.0000 mL | Freq: Once | ORAL | Status: AC
  Administered 2021-04-20: 296 mL via ORAL
  Filled 2021-04-20: qty 296

## 2021-04-20 MED ORDER — ONDANSETRON HCL 4 MG PO TABS: 4.0000 mg | ORAL_TABLET | Freq: Four times a day (QID) | ORAL | Status: DC | PRN

## 2021-04-20 MED ORDER — BISACODYL 10 MG RE SUPP: 10.0000 mg | Freq: Every day | RECTAL | Status: DC | PRN

## 2021-04-20 MED ORDER — ZOLPIDEM TARTRATE 5 MG PO TABS
5.0000 mg | ORAL_TABLET | Freq: Every evening | ORAL | Status: DC | PRN
Start: 2021-04-20 — End: 2021-04-20

## 2021-04-20 MED ORDER — CHLORHEXIDINE GLUCONATE 0.12 % MT SOLN: 15.0000 mL | Freq: Once | OROMUCOSAL | Status: AC

## 2021-04-20 MED ORDER — DEXAMETHASONE SODIUM PHOSPHATE 10 MG/ML IJ SOLN: 8.0000 mg | Freq: Once | INTRAMUSCULAR | Status: AC

## 2021-04-20 MED ORDER — TRANEXAMIC ACID-NACL 1000-0.7 MG/100ML-% IV SOLN
INTRAVENOUS | Status: AC
  Administered 2021-04-20: 1000 mg via INTRAVENOUS
  Filled 2021-04-20: qty 100

## 2021-04-20 MED ORDER — MIDAZOLAM HCL 5 MG/5ML IJ SOLN
INTRAMUSCULAR | Status: DC | PRN
  Administered 2021-04-20: 2 mg via INTRAVENOUS

## 2021-04-20 MED ORDER — ATENOLOL 25 MG PO TABS
12.5000 mg | ORAL_TABLET | Freq: Every day | ORAL | Status: DC
  Administered 2021-04-20 – 2021-04-21 (×2): 12.5 mg via ORAL
  Filled 2021-04-20 (×2): qty 1

## 2021-04-20 MED ORDER — FENTANYL CITRATE (PF) 100 MCG/2ML IJ SOLN: 25.0000 ug | INTRAMUSCULAR | Status: DC | PRN

## 2021-04-20 MED FILL — Bupivacaine HCl Preservative Free (PF) Inj 0.5%: INTRAMUSCULAR | Qty: 60 | Status: AC

## 2021-04-20 MED FILL — Sodium Chloride Preservative Free (PF) Inj 0.9%: INTRAMUSCULAR | Qty: 40 | Status: AC

## 2021-04-20 MED FILL — Bupivacaine Liposome Inj 1.3% (13.3 MG/ML): INTRAMUSCULAR | Qty: 20 | Status: AC

## 2021-04-20 SURGICAL SUPPLY — 73 items
ATTUNE MED DOME PAT 38 KNEE (Knees) ×1 IMPLANT
ATTUNE PSFEM RTSZ5 NARCEM KNEE (Femur) ×1 IMPLANT
ATTUNE PSRP INSE SZ5 7 KNEE (Insert) ×1 IMPLANT
BASEPLATE TIBIAL ROTATING SZ 4 (Knees) ×1 IMPLANT
BATTERY INSTRU NAVIGATION (MISCELLANEOUS) ×8 IMPLANT
BLADE SAW 70X12.5 (BLADE) ×2 IMPLANT
BLADE SAW 90X13X1.19 OSCILLAT (BLADE) ×2 IMPLANT
BLADE SAW 90X25X1.19 OSCILLAT (BLADE) ×2 IMPLANT
CEMENT HV SMART SET (Cement) ×2 IMPLANT
COOLER POLAR GLACIER W/PUMP (MISCELLANEOUS) ×2 IMPLANT
CUFF TOURN SGL QUICK 24 (TOURNIQUET CUFF)
CUFF TOURN SGL QUICK 34 (TOURNIQUET CUFF)
CUFF TRNQT CYL 24X4X16.5-23 (TOURNIQUET CUFF) IMPLANT
CUFF TRNQT CYL 34X4.125X (TOURNIQUET CUFF) IMPLANT
DRAPE 3/4 80X56 (DRAPES) ×2 IMPLANT
DRSG DERMACEA 8X12 NADH (GAUZE/BANDAGES/DRESSINGS) ×2 IMPLANT
DRSG MEPILEX SACRM 8.7X9.8 (GAUZE/BANDAGES/DRESSINGS) ×2 IMPLANT
DRSG OPSITE POSTOP 4X14 (GAUZE/BANDAGES/DRESSINGS) ×2 IMPLANT
DRSG TEGADERM 4X4.75 (GAUZE/BANDAGES/DRESSINGS) ×2 IMPLANT
DURAPREP 26ML APPLICATOR (WOUND CARE) ×4 IMPLANT
ELECT CAUTERY BLADE 6.4 (BLADE) ×3 IMPLANT
ELECT REM PT RETURN 9FT ADLT (ELECTROSURGICAL) ×2
ELECTRODE REM PT RTRN 9FT ADLT (ELECTROSURGICAL) ×1 IMPLANT
EX-PIN ORTHOLOCK NAV 4X150 (PIN) ×4 IMPLANT
GAUZE 4X4 16PLY ~~LOC~~+RFID DBL (SPONGE) ×2 IMPLANT
GLOVE SURG ENC MOIS LTX SZ7.5 (GLOVE) ×4 IMPLANT
GLOVE SURG ENC TEXT LTX SZ7.5 (GLOVE) ×4 IMPLANT
GLOVE SURG UNDER LTX SZ8 (GLOVE) ×2 IMPLANT
GLOVE SURG UNDER POLY LF SZ7.5 (GLOVE) ×2 IMPLANT
GOWN STRL REUS W/ TWL LRG LVL3 (GOWN DISPOSABLE) ×2 IMPLANT
GOWN STRL REUS W/ TWL XL LVL3 (GOWN DISPOSABLE) ×1 IMPLANT
GOWN STRL REUS W/TWL LRG LVL3 (GOWN DISPOSABLE) ×2
GOWN STRL REUS W/TWL XL LVL3 (GOWN DISPOSABLE) ×1
HEMOVAC 400CC 10FR (MISCELLANEOUS) ×2 IMPLANT
HOLDER FOLEY CATH W/STRAP (MISCELLANEOUS) ×2 IMPLANT
HOOD PEEL AWAY FLYTE STAYCOOL (MISCELLANEOUS) ×4 IMPLANT
IRRIGATION SURGIPHOR STRL (IV SOLUTION) ×2 IMPLANT
IV NS IRRIG 3000ML ARTHROMATIC (IV SOLUTION) ×2 IMPLANT
KIT TURNOVER KIT A (KITS) ×2 IMPLANT
KNIFE SCULPS 14X20 (INSTRUMENTS) ×2 IMPLANT
LABEL OR SOLS (LABEL) ×2 IMPLANT
MANIFOLD NEPTUNE II (INSTRUMENTS) ×4 IMPLANT
NDL SAFETY ECLIPSE 18X1.5 (NEEDLE) ×1 IMPLANT
NDL SPNL 20GX3.5 QUINCKE YW (NEEDLE) ×2 IMPLANT
NEEDLE HYPO 18GX1.5 SHARP (NEEDLE) ×1
NEEDLE SPNL 20GX3.5 QUINCKE YW (NEEDLE) ×4 IMPLANT
NS IRRIG 500ML POUR BTL (IV SOLUTION) ×2 IMPLANT
PACK TOTAL KNEE (MISCELLANEOUS) ×2 IMPLANT
PAD WRAPON POLAR KNEE (MISCELLANEOUS) ×1 IMPLANT
PENCIL SMOKE EVACUATOR COATED (MISCELLANEOUS) ×2 IMPLANT
PIN DRILL FIX HALF THREAD (BIT) ×4 IMPLANT
PIN DRILL QUICK PACK ×6 IMPLANT
PIN FIXATION 1/8DIA X 3INL (PIN) ×2 IMPLANT
PULSAVAC PLUS IRRIG FAN TIP (DISPOSABLE) ×2
SOL PREP PVP 2OZ (MISCELLANEOUS) ×2
SOLUTION PREP PVP 2OZ (MISCELLANEOUS) ×1 IMPLANT
SPONGE DRAIN TRACH 4X4 STRL 2S (GAUZE/BANDAGES/DRESSINGS) ×2 IMPLANT
SPONGE T-LAP 18X18 ~~LOC~~+RFID (SPONGE) ×6 IMPLANT
STAPLER SKIN PROX 35W (STAPLE) ×2 IMPLANT
STOCKINETTE IMPERV 14X48 (MISCELLANEOUS) IMPLANT
STRAP TIBIA SHORT (MISCELLANEOUS) ×2 IMPLANT
SUCTION FRAZIER HANDLE 10FR (MISCELLANEOUS) ×1
SUCTION TUBE FRAZIER 10FR DISP (MISCELLANEOUS) ×1 IMPLANT
SUT VIC AB 0 CT1 36 (SUTURE) ×4 IMPLANT
SUT VIC AB 1 CT1 36 (SUTURE) ×4 IMPLANT
SUT VIC AB 2-0 CT2 27 (SUTURE) ×2 IMPLANT
SYR 20ML LL LF (SYRINGE) ×2 IMPLANT
SYR 30ML LL (SYRINGE) ×4 IMPLANT
TIP FAN IRRIG PULSAVAC PLUS (DISPOSABLE) ×1 IMPLANT
TOWEL OR 17X26 4PK STRL BLUE (TOWEL DISPOSABLE) ×2 IMPLANT
TOWER CARTRIDGE SMART MIX (DISPOSABLE) ×2 IMPLANT
TRAY FOLEY MTR SLVR 16FR STAT (SET/KITS/TRAYS/PACK) ×2 IMPLANT
WRAPON POLAR PAD KNEE (MISCELLANEOUS) ×2

## 2021-04-20 NOTE — Transfer of Care (Signed)
Immediate Anesthesia Transfer of Care Note  Patient: Kelly Vaughn  Procedure(s) Performed: COMPUTER ASSISTED TOTAL KNEE ARTHROPLASTY (Right: Knee)  Patient Location: PACU  Anesthesia Type:MAC and Spinal  Level of Consciousness: awake and alert   Airway & Oxygen Therapy: Patient Spontanous Breathing  Post-op Assessment: Report given to RN and Post -op Vital signs reviewed and stable  Post vital signs: Reviewed and stable  Last Vitals:  Vitals Value Taken Time  BP 98/60 04/20/21 1039  Temp 36.3 C 04/20/21 1039  Pulse 98 04/20/21 1042  Resp 16 04/20/21 1042  SpO2 98 % 04/20/21 1042  Vitals shown include unvalidated device data.  Last Pain:  Vitals:   04/20/21 0621  TempSrc: Temporal  PainSc: 1          Complications: No notable events documented.

## 2021-04-20 NOTE — Anesthesia Preprocedure Evaluation (Signed)
Anesthesia Evaluation  Patient identified by MRN, date of birth, ID band Patient awake    Reviewed: Allergy & Precautions, NPO status , Patient's Chart, lab work & pertinent test results  History of Anesthesia Complications (+) Family history of anesthesia reaction and history of anesthetic complications (woke up during colonoscopy)  Airway Mallampati: II  TM Distance: >3 FB Neck ROM: Full    Dental  (+) Missing, Implants, Dental Advidsory Given   Pulmonary neg shortness of breath, sleep apnea , neg COPD, neg recent URI,    breath sounds clear to auscultation- rhonchi (-) wheezing      Cardiovascular Exercise Tolerance: Good (-) hypertension(-) angina+ Past MI  (-) Cardiac Stents and (-) CABG Dysrhythmias: LBBB.  Rhythm:Regular Rate:Normal - Systolic murmurs and - Diastolic murmurs    Neuro/Psych  Headaches, neg Seizures PSYCHIATRIC DISORDERS Anxiety Depression Bipolar Disorder    GI/Hepatic Neg liver ROS, GERD  ,  Endo/Other  Hypothyroidism   Renal/GU negative Renal ROS     Musculoskeletal   Abdominal (+) - obese,   Peds  Hematology  (+) Blood dyscrasia, anemia ,   Anesthesia Other Findings Past Medical History: No date: allergic rhinitis No date: Anemia No date: Anxiety No date: Arthritis No date: Basal cell carcinoma No date: Complication of anesthesia     Comment:  woke up during one colonoscopy No date: Depression No date: Eating disorder No date: Family history of adverse reaction to anesthesia     Comment:  paternal grandfather-overdosed on ether No date: GERD (gastroesophageal reflux disease)     Comment:  occ tums prn No date: Headache disorder     Comment:  h/o migraines No date: Heart murmur No date: Hx of colonic polyps No date: Hyperlipidemia No date: hypothyroidism No date: Hypothyroidism No date: Urinary incontinence   Reproductive/Obstetrics                              Anesthesia Physical  Anesthesia Plan  ASA: 3  Anesthesia Plan: Spinal   Post-op Pain Management:    Induction:   PONV Risk Score and Plan: 2 and TIVA and Propofol infusion  Airway Management Planned: Natural Airway and Simple Face Mask  Additional Equipment:   Intra-op Plan:   Post-operative Plan:   Informed Consent: I have reviewed the patients History and Physical, chart, labs and discussed the procedure including the risks, benefits and alternatives for the proposed anesthesia with the patient or authorized representative who has indicated his/her understanding and acceptance.     Dental advisory given  Plan Discussed with: CRNA and Anesthesiologist  Anesthesia Plan Comments:         Anesthesia Quick Evaluation

## 2021-04-20 NOTE — Progress Notes (Signed)
Met with the patient to discuss DC plan and needs She lives at home with her husband She has a 3 in 1 at home She will need a RW and I notified Rhonda at [facility], it will be delivered to her room prior to DC She has transportation and can afford her meds  She is set up with [facility]

## 2021-04-20 NOTE — Anesthesia Procedure Notes (Signed)
Procedure Name: MAC Date/Time: 04/20/2021 7:30 AM Performed by: Nelda Marseille, CRNA Pre-anesthesia Checklist: Patient identified, Emergency Drugs available, Suction available, Patient being monitored and Timeout performed Patient Re-evaluated:Patient Re-evaluated prior to induction Oxygen Delivery Method: Simple face mask

## 2021-04-20 NOTE — Op Note (Signed)
OPERATIVE NOTE  DATE OF SURGERY:  04/20/2021  PATIENT NAME:  Kelly Vaughn   DOB: 1956-05-17  MRN: [id number]  PRE-OPERATIVE DIAGNOSIS: Degenerative arthrosis of the right knee, primary  POST-OPERATIVE DIAGNOSIS:  Same  PROCEDURE:  Right total knee arthroplasty using computer-assisted navigation  SURGEON:  Marciano Sequin. M.D.  ASSISTANT: Cassell Smiles, PA-C (present and scrubbed throughout the case, critical for assistance with exposure, retraction, instrumentation, and closure)  ANESTHESIA: spinal  ESTIMATED BLOOD LOSS: 50 mL  FLUIDS REPLACED: 1150 mL of crystalloid  TOURNIQUET TIME: 80 minutes  DRAINS: 2 medium Hemovac drains  SOFT TISSUE RELEASES: Anterior cruciate ligament, posterior cruciate ligament, deep medial collateral ligament, patellofemoral ligament  IMPLANTS UTILIZED: DePuy Attune size 5N posterior stabilized femoral component (cemented), size 4 rotating platform tibial component (cemented), 38 mm medialized dome patella (cemented), and a 7 mm stabilized rotating platform polyethylene insert.  INDICATIONS FOR SURGERY: Kelly Vaughn is a 65 y.o. year old female with a long history of progressive knee pain. X-rays demonstrated severe degenerative changes in tricompartmental fashion. The patient had not seen any significant improvement despite conservative nonsurgical intervention. After discussion of the risks and benefits of surgical intervention, the patient expressed understanding of the risks benefits and agree with plans for total knee arthroplasty.   The risks, benefits, and alternatives were discussed at length including but not limited to the risks of infection, bleeding, nerve injury, stiffness, blood clots, the need for revision surgery, cardiopulmonary complications, among others, and they were willing to proceed.  PROCEDURE IN DETAIL: The patient was brought into the operating room and, after adequate spinal anesthesia was achieved, a tourniquet was  placed on the patient's upper thigh. The patient's knee and leg were cleaned and prepped with alcohol and DuraPrep and draped in the usual sterile fashion. A "timeout" was performed as per usual protocol. The lower extremity was exsanguinated using an Esmarch, and the tourniquet was inflated to 300 mmHg. An anterior longitudinal incision was made followed by a standard mid vastus approach. The deep fibers of the medial collateral ligament were elevated in a subperiosteal fashion off of the medial flare of the tibia so as to maintain a continuous soft tissue sleeve. The patella was subluxed laterally and the patellofemoral ligament was incised. Inspection of the knee demonstrated severe degenerative changes with full-thickness loss of articular cartilage. Osteophytes were debrided using a rongeur. Anterior and posterior cruciate ligaments were excised. Two 4.0 mm Schanz pins were inserted in the femur and into the tibia for attachment of the array of trackers used for computer-assisted navigation. Hip center was identified using a circumduction technique. Distal landmarks were mapped using the computer. The distal femur and proximal tibia were mapped using the computer. The distal femoral cutting guide was positioned using computer-assisted navigation so as to achieve a 5 distal valgus cut. The femur was sized and it was felt that a size 5N femoral component was appropriate. A size 5 femoral cutting guide was positioned and the anterior cut was performed and verified using the computer. This was followed by completion of the posterior and chamfer cuts. Femoral cutting guide for the central box was then positioned in the center box cut was performed.  Attention was then directed to the proximal tibia. Medial and lateral menisci were excised. The extramedullary tibial cutting guide was positioned using computer-assisted navigation so as to achieve a 0 varus-valgus alignment and 3 posterior slope. The cut was  performed and verified using the computer. The proximal tibia was  sized and it was felt that a size 4 tibial tray was appropriate. Tibial and femoral trials were inserted followed by insertion of a 7 mm polyethylene insert. This allowed for excellent mediolateral soft tissue balancing both in flexion and in full extension. Finally, the patella was cut and prepared so as to accommodate a 38 mm medialized dome patella. A patella trial was placed and the knee was placed through a range of motion with excellent patellar tracking appreciated. The femoral trial was removed after debridement of posterior osteophytes. The central post-hole for the tibial component was reamed followed by insertion of a keel punch. Tibial trials were then removed. Cut surfaces of bone were irrigated with copious amounts of normal saline using pulsatile lavage and then suctioned dry. Polymethylmethacrylate cement was prepared in the usual fashion using a vacuum mixer. Cement was applied to the cut surface of the proximal tibia as well as along the undersurface of a size 4 rotating platform tibial component. Tibial component was positioned and impacted into place. Excess cement was removed using [employer] fast streamer. Cement was then applied to the cut surfaces of the femur as well as along the posterior flanges of the size 5N femoral component. The femoral component was positioned and impacted into place. Excess cement was removed using [employer] fast streamer. A 7 mm polyethylene trial was inserted and the knee was brought into full extension with steady axial compression applied. Finally, cement was applied to the backside of a 38 mm medialized dome patella and the patellar component was positioned and patellar clamp applied. Excess cement was removed using [employer] fast streamer. After adequate curing of the cement, the tourniquet was deflated after a total tourniquet time of 80 minutes. Hemostasis was achieved using electrocautery. The knee was irrigated with  copious amounts of normal saline using pulsatile lavage followed by 500 ml of Surgiphor and then suctioned dry. 20 mL of 1.3% Exparel and 60 mL of 0.25% Marcaine in 40 mL of normal saline was injected along the posterior capsule, medial and lateral gutters, and along the arthrotomy site. A 7 mm stabilized rotating platform polyethylene insert was inserted and the knee was placed through a range of motion with excellent mediolateral soft tissue balancing appreciated and excellent patellar tracking noted. 2 medium drains were placed in the wound bed and brought out through separate stab incisions. The medial parapatellar portion of the incision was reapproximated using interrupted sutures of #1 Vicryl. Subcutaneous tissue was approximated in layers using first #0 Vicryl followed #2-0 Vicryl. The skin was approximated with skin staples. A sterile dressing was applied.  The patient tolerated the procedure well and was transported to the recovery room in stable condition.    Gabbi Whetstone P. Holley Bouche., M.D.

## 2021-04-20 NOTE — Evaluation (Signed)
Physical Therapy Evaluation Patient Details Name: Kelly Vaughn MRN: [id number] DOB: 12/19/1955 Today's Date: 04/20/2021   History of Present Illness  Pt is 65 y.o. female s/p R TKA on 04/20/21.  Pt has PMH including: sleep apnea, headaches, anxitey, depression, bipolar disorder, anemia, arthritis, eating disorder, GERD, heart murmur, hypothyroidism, urinary incontinence, and hx L TKA 10/15/2019.    Clinical Impression  Pt received in Semi-Fowler's position and agreeable to therapy.  Pt has prior hx of L TKA, so pt was able to recall several of the exercises and was able to perform with good technique.  Pt encouraged to continue with the exercises following session until seen tomorrow AM for treatment.  Pt then proceeded to perform transfer to standing with modI.  Pt then performed two laps around nursing station with stair training performed in the middle of the second lap.  Pt performed well, however would benefit from addition stair training to increase total recall.  Pt proceeded to ambulate back to room and was able to navigate bathroom and perform self-care with CGA.  Pt then transferred back to bed and was left with all needs met and call bell within reach and husband in room.  Pt will benefit from skilled PT intervention to increase independence and safety with basic mobility in preparation for discharge to the venue listed below.       Follow Up Recommendations Home health PT    Equipment Recommendations  Rolling walker with 5" wheels;3in1 (PT)    Recommendations for Other Services       Precautions / Restrictions Precautions Precautions: Fall;Knee Precaution Booklet Issued: Yes (comment) Precaution Comments: no KI required Restrictions Weight Bearing Restrictions: Yes RLE Weight Bearing: Weight bearing as tolerated      Mobility  Bed Mobility Overal bed mobility: Modified Independent             General bed mobility comments: extra time given for mobility     Transfers Overall transfer level: Modified independent Equipment used: Rolling walker (2 wheeled)             General transfer comment: only needing VC's for hand placement as reminder  Ambulation/Gait Ambulation/Gait assistance: Min guard Gait Distance (Feet): 320 Feet Assistive device: Rolling walker (2 wheeled) Gait Pattern/deviations: WFL(Within Functional Limits);Step-through pattern;Decreased step length - left;Decreased stance time - right Gait velocity: slightly decreased   General Gait Details: Pt progressively increase step length and was able to ambulate with increased speed towards end of session.  Stairs Stairs: Yes Stairs assistance: Min guard Stair Management: No rails;Step to pattern;Backwards;With walker Number of Stairs: 4 General stair comments: Pt with good stair training recall, however would benefit from practicing again at later session.  Wheelchair Mobility    Modified Rankin (Stroke Patients Only)       Balance Overall balance assessment: Needs assistance Sitting-balance support: No upper extremity supported;Feet supported Sitting balance-Leahy Scale: Normal     Standing balance support: Bilateral upper extremity supported;During functional activity Standing balance-Leahy Scale: Good                               Pertinent Vitals/Pain Pain Assessment: No/denies pain    Home Living Family/patient expects to be discharged to:: Private residence Living Arrangements: Spouse/significant other Available Help at Discharge: Family;Available 24 hours/day Type of Home: House Home Access: Stairs to enter Entrance Stairs-Rails: None Entrance Stairs-Number of Steps: 2 Home Layout: One level Home Equipment: Cane - single  point;Crutches;Hand held shower head;Bedside commode;Adaptive equipment;Shower seat      Prior Function Level of Independence: Independent               Hand Dominance   Dominant Hand: Right     Extremity/Trunk Assessment   Upper Extremity Assessment Upper Extremity Assessment: Overall WFL for tasks assessed    Lower Extremity Assessment Lower Extremity Assessment: RLE deficits/detail RLE Deficits / Details: expected post-op strength/ROM deficits       Communication   Communication: No difficulties  Cognition Arousal/Alertness: Awake/alert Behavior During Therapy: WFL for tasks assessed/performed Overall Cognitive Status: Within Functional Limits for tasks assessed                                        General Comments      Exercises Total Joint Exercises Ankle Circles/Pumps: AROM;Strengthening;Both;10 reps;Supine Quad Sets: AROM;Strengthening;Both;10 reps;Supine Gluteal Sets: AROM;Strengthening;Both;10 reps;Supine Heel Slides: AROM;Strengthening;Both;10 reps;Supine Hip ABduction/ADduction: AROM;Strengthening;Both;10 reps;Supine Straight Leg Raises: AROM;Strengthening;Both;10 reps;Supine Long Arc Quad: AROM;Strengthening;Both;10 reps;Seated Marching in Standing: AROM;Strengthening;Both;10 reps;Standing Other Exercises Other Exercises: Pt/spouse instructed in home/routines modifications, falls prevention, pet care considerations, polar care mgt, compression stocking mgt, and AE/DME for ADL. Handout provided. Other Exercises: Pt and spouse educated on role of PT and services provided during hospital stay.  Pt also encouraged and educated on exercises to perform in order to achieve greater ROM with HHPT/outpatient PT following d/c.   Assessment/Plan    PT Assessment Patient needs continued PT services  PT Problem List Decreased strength;Decreased range of motion;Decreased activity tolerance       PT Treatment Interventions Gait training;Stair training;Therapeutic activities;Therapeutic exercise;DME instruction;Balance training;Neuromuscular re-education    PT Goals (Current goals can be found in the Care Plan section)  Acute Rehab PT  Goals Patient Stated Goal: to go home. PT Goal Formulation: With patient Time For Goal Achievement: 05/04/21 Potential to Achieve Goals: Good    Frequency BID   Barriers to discharge        Co-evaluation               AM-PAC PT "6 Clicks" Mobility  Outcome Measure Help needed turning from your back to your side while in a flat bed without using bedrails?: None Help needed moving from lying on your back to sitting on the side of a flat bed without using bedrails?: None Help needed moving to and from a bed to a chair (including a wheelchair)?: None Help needed standing up from a chair using your arms (e.g., wheelchair or bedside chair)?: None Help needed to walk in hospital room?: A Little Help needed climbing 3-5 steps with a railing? : A Little 6 Click Score: 22    End of Session Equipment Utilized During Treatment: Gait belt Activity Tolerance: Patient tolerated treatment well Patient left: in bed;with call bell/phone within reach;with family/visitor present Nurse Communication: Mobility status PT Visit Diagnosis: Other abnormalities of gait and mobility (R26.89);Muscle weakness (generalized) (M62.81)    Time: 5208-0223 PT Time Calculation (min) (ACUTE ONLY): 58 min   Charges:   PT Evaluation $PT Eval Low Complexity: 1 Low PT Treatments $Gait Training: 23-37 mins $Therapeutic Exercise: 23-37 mins        Gwenlyn Saran, PT, DPT 04/20/21, 5:16 PM   [name] 04/20/2021, 5:13 PM

## 2021-04-20 NOTE — H&P (Signed)
The patient has been re-examined, and the chart reviewed, and there have been no interval changes to the documented history and physical.    The risks, benefits, and alternatives have been discussed at length. The patient expressed understanding of the risks benefits and agreed with plans for surgical intervention.  Thane Age P. Mendy Lapinsky, Jr. M.D.    

## 2021-04-20 NOTE — Anesthesia Procedure Notes (Signed)
Spinal  Patient location during procedure: OR Start time: 04/20/2021 7:20 AM End time: 04/20/2021 7:24 AM Reason for block: surgical anesthesia Staffing Performed: other anesthesia staff  Anesthesiologist: Tawnie Clan, MD Resident/CRNA: Nelda Marseille, CRNA Other anesthesia staff: Levert Feinstein, RN Preanesthetic Checklist Completed: patient identified, IV checked, site marked, risks and benefits discussed, surgical consent, monitors and equipment checked, pre-op evaluation and timeout performed Spinal Block Patient position: sitting Prep: DuraPrep Patient monitoring: heart rate, cardiac monitor, continuous pulse ox and blood pressure Approach: midline Location: L3-4 Injection technique: single-shot Needle Needle type: Sprotte  Needle gauge: 24 G Needle length: 9 cm Assessment Sensory level: T4 Events: CSF return

## 2021-04-20 NOTE — Evaluation (Signed)
Occupational Therapy Evaluation Patient Details Name: Kelly Vaughn MRN: [id number] DOB: 12/26/1955 Today's Date: 04/20/2021    History of Present Illness Pt is 65 y.o. female s/p R TKA on 04/20/21.  Pt has PMH including: sleep apnea, headaches, anxitey, depression, bipolar disorder, anemia, arthritis, eating disorder, GERD, heart murmur, hypothyroidism, urinary incontinence, and hx L TKA 10/15/2019.   Clinical Impression   Pt seen for OT evaluation this date, POD#0 from above surgery. Pt was independent in all ADLs prior to surgery and she is eager to return to PLOF with less pain and improved safety and independence. Pt currently requires PRN minimal assist for LB dressing and bathing while in seated position due to pain and limited AROM of R knee. Pt/spouse instructed in polar care mgt, falls prevention strategies, pet care considerations, home/routines modifications, DME/AE for LB bathing and dressing tasks, and compression stocking mgt. Handout provided. Pt/spouse verbalized understanding and denied additional concerns. Pt with good home set up and recall of TKA recovery following L TKA last year. All education/training provided at time of evaluation. Do not currently anticipate any additional skilled OT needs at this time. Will sign off.       Follow Up Recommendations  No OT follow up    Equipment Recommendations  None recommended by OT    Recommendations for Other Services       Precautions / Restrictions Precautions Precautions: Fall;Knee Precaution Booklet Issued: Yes (comment) Precaution Comments: no KI required Restrictions Weight Bearing Restrictions: Yes RLE Weight Bearing: Weight bearing as tolerated      Mobility Bed Mobility               General bed mobility comments: deferred, just back to bed    Transfers                      Balance                                           ADL either performed or assessed with clinical  judgement   ADL                                         General ADL Comments: Pt currently requires PRN MIN A for LB ADL from seated position 2/2 decreased strength, ROM, and pain in R knee with AROM. Anticipate supervision + RW for ADL transfers. Spouse endorses ability to provide assist needed upon return home.     Vision Baseline Vision/History: Wears glasses Wears Glasses: At all times Patient Visual Report: No change from baseline       Perception     Praxis      Pertinent Vitals/Pain Pain Assessment: No/denies pain     Hand Dominance Right   Extremity/Trunk Assessment Upper Extremity Assessment Upper Extremity Assessment: Overall WFL for tasks assessed   Lower Extremity Assessment Lower Extremity Assessment: RLE deficits/detail RLE Deficits / Details: expected post-op strength/ROM deficits       Communication Communication Communication: No difficulties   Cognition Arousal/Alertness: Awake/alert Behavior During Therapy: WFL for tasks assessed/performed Overall Cognitive Status: Within Functional Limits for tasks assessed  General Comments       Exercises Other Exercises Other Exercises: Pt/spouse instructed in home/routines modifications, falls prevention, pet care considerations, polar care mgt, compression stocking mgt, and AE/DME for ADL. Handout provided.   Shoulder Instructions      Home Living Family/patient expects to be discharged to:: Private residence Living Arrangements: Spouse/significant other Available Help at Discharge: Family;Available 24 hours/day Type of Home: House Home Access: Stairs to enter [employer] of Steps: 2 Entrance Stairs-Rails: None Home Layout: One level     Bathroom Shower/Tub: Tub/shower unit;Walk-in shower   Bathroom Toilet: Standard     Home Equipment: Cane - single point;Crutches;Hand held shower head;Bedside commode;Adaptive  equipment;Shower Theme park manager: Reacher        Prior Functioning/Environment Level of Independence: Independent                 OT Problem List: Decreased range of motion;Decreased strength      OT Treatment/Interventions:      OT Goals(Current goals can be found in the care plan section) Acute Rehab OT Goals Patient Stated Goal: go home OT Goal Formulation: All assessment and education complete, DC therapy  OT Frequency:     Barriers to D/C:            Co-evaluation              AM-PAC OT "6 Clicks" Daily Activity     Outcome Measure Help from another person eating meals?: None Help from another person taking care of personal grooming?: None Help from another person toileting, which includes using toliet, bedpan, or urinal?: A Little Help from another person bathing (including washing, rinsing, drying)?: A Little Help from another person to put on and taking off regular upper body clothing?: None Help from another person to put on and taking off regular lower body clothing?: A Little 6 Click Score: 21   End of Session    Activity Tolerance: Patient tolerated treatment well Patient left: in bed;with call bell/phone within reach;with bed alarm set;with family/visitor present;with SCD's reapplied;Other (comment) (polar care in place)  OT Visit Diagnosis: Other abnormalities of gait and mobility (R26.89)                Time: [id number] OT Time Calculation (min): 19 min Charges:  OT General Charges $OT Visit: 1 Visit OT Evaluation $OT Eval Low Complexity: 1 Low OT Treatments $Self Care/Home Management : 8-22 mins  Hanley Hays, MPH, MS, OTR/L ascom [phone] 04/20/21, 4:42 PM

## 2021-04-21 ENCOUNTER — Encounter: Payer: Self-pay | Admitting: Orthopedic Surgery

## 2021-04-21 MED ORDER — ENOXAPARIN SODIUM 40 MG/0.4ML IJ SOSY: 40.0000 mg | PREFILLED_SYRINGE | INTRAMUSCULAR | 0 refills | Status: AC

## 2021-04-21 MED ORDER — CELECOXIB 200 MG PO CAPS: 200.0000 mg | ORAL_CAPSULE | Freq: Two times a day (BID) | ORAL | 0 refills | Status: AC

## 2021-04-21 MED ORDER — TRAMADOL HCL 50 MG PO TABS: 50.0000 mg | ORAL_TABLET | ORAL | 0 refills | Status: AC | PRN

## 2021-04-21 NOTE — Discharge Summary (Signed)
Physician Discharge Summary  Patient ID: Kelly Vaughn MRN: [id number] DOB/AGE: 65-May-1957 65 y.o.  Admit date: 04/20/2021 Discharge date: 04/21/2021  Admission Diagnoses:  Total knee replacement status [Z96.659]  Surgeries:Procedure(s):   Right total knee arthroplasty using computer-assisted navigation   SURGEON:  Marciano Sequin. M.D.   ASSISTANT: Cassell Smiles, PA-C (present and scrubbed throughout the case, critical for assistance with exposure, retraction, instrumentation, and closure)   ANESTHESIA: spinal   ESTIMATED BLOOD LOSS: 50 mL   FLUIDS REPLACED: 1150 mL of crystalloid   TOURNIQUET TIME: 80 minutes   DRAINS: 2 medium Hemovac drains   SOFT TISSUE RELEASES: Anterior cruciate ligament, posterior cruciate ligament, deep medial collateral ligament, patellofemoral ligament   IMPLANTS UTILIZED: DePuy Attune size 5N posterior stabilized femoral component (cemented), size 4 rotating platform tibial component (cemented), 38 mm medialized dome patella (cemented), and a 7 mm stabilized rotating platform polyethylene insert.  Discharge Diagnoses: Patient Active Problem List   Diagnosis Date Noted   Total knee replacement status 04/20/2021   Status post total left knee replacement 12/02/2019   Depressive disorder 08/11/2019   Sleep apnea, unspecified 08/11/2019   BMI 28.0-28.9,adult 05/27/2019   Infected insect bite of left leg 05/27/2019   Chronic fatigue 02/22/2019   Polyarthralgia 02/22/2019   Primary osteoarthritis involving multiple joints 02/22/2019   NSTEMI (non-ST elevated myocardial infarction) ([place]) 05/07/2017   Stress-induced cardiomyopathy 05/07/2017   Leukopenia 06/06/2016   Thrombocytopenia ([place]) 06/06/2016   Chronic toe pain, left foot 12/22/2015   Allergic state 04/15/2015   Acid reflux 04/15/2015   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   Cephalalgia 04/15/2015   History of colon polyps 04/15/2015   Adiposity 04/15/2015    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) 04/16/2014   Insomnia related to another mental disorder 04/16/2014   Breast calcifications on mammogram 03/26/2014   Abnormal finding on breast imaging 03/26/2014   Cognitive complaints 02/13/2014   Encounter for preventive health examination 02/02/2014   Memory deficits 02/02/2014   Amnesia 02/02/2014   Encounter for general adult medical examination without abnormal findings 02/02/2014   Murmur, heart 05/11/2013   Palpitations 05/11/2013   Cardiac murmur 05/11/2013   Unspecified hypothyroidism 11/12/2012   Menopausal syndrome 11/12/2012   Left knee DJD 11/12/2012   History of whiplash injury 11/12/2012   Adult hypothyroidism 11/12/2012   Arthritis, degenerative 11/12/2012   H/O injury, presenting hazards to health 11/12/2012   Menopausal symptom 11/12/2012   allergic rhinitis    Bipolar I disorder, most recent episode depressed ([place])    Obesity, unspecified    Headache disorder    GERD (gastroesophageal reflux disease)    Hyperlipidemia    Urinary incontinence    Hx of colonic polyps    Basal cell carcinoma     Past Medical History:  Diagnosis Date   allergic rhinitis    Anemia    Anxiety    Arthritis    Basal cell carcinoma    Complication of anesthesia    woke up during one colonoscopy   Depression    Dyspnea    Eating disorder    Family history of adverse reaction to anesthesia    paternal grandfather-overdosed on ether   GERD (gastroesophageal reflux disease)    occ tums prn   Headache disorder    h/o migraines   Heart murmur    Hx of colonic polyps    Hyperlipidemia    hypothyroidism    Hypothyroidism    Urinary incontinence  Transfusion:    Consultants (if any):   Discharged Condition: Improved  Hospital Course: Kelly Vaughn is an 65 y.o. female who was admitted 04/20/2021 with a diagnosis of right knee osteoarthritis and went to the operating room on 04/20/2021 and underwent right total knee  arthroplasty. The patient received perioperative antibiotics for prophylaxis (see below). The patient tolerated the procedure well and was transported to PACU in stable condition. After meeting PACU criteria, the patient was subsequently transferred to the Orthopaedics/Rehabilitation unit.   The patient received DVT prophylaxis in the form of early mobilization, Lovenox, Foot Pumps, and TED hose. A sacral pad had been placed and heels were elevated off of the bed with rolled towels in order to protect skin integrity. Foley catheter was discontinued on postoperative day #0. Wound drains were discontinued on postoperative day #1. The surgical incision was healing well without signs of infection.  Physical therapy was initiated postoperatively for transfers, gait training, and strengthening. Occupational therapy was initiated for activities of daily living and evaluation for assisted devices. Rehabilitation goals were reviewed in detail with the patient. The patient made steady progress with physical therapy and physical therapy recommended discharge to Home.   The patient achieved the preliminary goals of this hospitalization and was felt to be medically and orthopaedically appropriate for discharge.  She was given perioperative antibiotics:  Anti-infectives (From admission, onward)    Start     Dose/Rate Route Frequency Ordered Stop   04/20/21 1400  ceFAZolin (ANCEF) IVPB 2g/100 mL premix        2 g 200 mL/hr over 30 Minutes Intravenous Every 6 hours 04/20/21 1231 04/20/21 1532   04/20/21 0600  ceFAZolin (ANCEF) IVPB 2g/100 mL premix        2 g 200 mL/hr over 30 Minutes Intravenous On call to O.R. 04/20/21 [id number] 04/20/21 0750     .  Recent vital signs:  Vitals:   04/21/21 0023 04/21/21 0544  BP: (!) 89/58 (!) 113/59  Pulse: 62 61  Resp: 17 17  Temp: 98.3 F (36.8 C) 97.7 F (36.5 C)  SpO2: 98% 100%    Recent laboratory studies:  No results for input(s): WBC, HGB, HCT, PLT, K, CL,  CO2, BUN, CREATININE, GLUCOSE, CALCIUM, LABPT, INR in the last 72 hours.  Diagnostic Studies: DG Knee Right Port  Result Date: 04/20/2021 CLINICAL DATA:  Postop knee arthroplasty. EXAM: PORTABLE RIGHT KNEE - 1-2 VIEW COMPARISON:  None. FINDINGS: Right total knee arthroplasty. Surgical drain and skin staples are in place. Subcutaneous/joint air in fluid are present. IMPRESSION: Right total knee arthroplasty with expected postoperative findings. Electronically Signed   By: Lorin Picket M.D.   On: 04/20/2021 11:21    Discharge Medications:   Allergies as of 04/21/2021       Reactions   Dicyclomine Hcl    Dizziness and blurred vision        Medication List     TAKE these medications    5-HTP 100 MG Caps Take 100 mg by mouth daily.   acetaminophen 650 MG CR tablet Commonly known as: TYLENOL Take 650 mg by mouth at bedtime.   Alpha Lipoic Acid 200 MG Caps Take 200 mg by mouth daily.   amoxicillin 500 MG capsule Commonly known as: AMOXIL Take 2,000 mg by mouth See admin instructions. Take 2000 mg 1 hour prior to dental work   atenolol 25 MG tablet Commonly known as: TENORMIN Take 12.5 mg by mouth daily.   calcium carbonate 750 MG chewable  tablet Commonly known as: TUMS EX Chew 1,500 mg by mouth daily as needed for heartburn.   celecoxib 200 MG capsule Commonly known as: CELEBREX Take 1 capsule (200 mg total) by mouth 2 (two) times daily. What changed:  when to take this reasons to take this   cholecalciferol 25 MCG (1000 UNIT) tablet Commonly known as: VITAMIN D3 Take 1,000 Units by mouth daily.   COQ-10 PO Take 1 tablet by mouth daily.   diclofenac Sodium 1 % Gel Commonly known as: VOLTAREN Apply 1 application topically 4 (four) times daily as needed (pain).   enoxaparin 40 MG/0.4ML injection Commonly known as: LOVENOX Inject 0.4 mLs (40 mg total) into the skin daily for 14 days.   Eszopiclone 3 MG Tabs Take 3 mg by mouth at bedtime. Take immediately before  bedtime   EVENING PRIMROSE OIL PO Take 1,500 mg by mouth daily.   Evolocumab 140 MG/ML Soaj Inject 140 mg into the skin every 14 (fourteen) days.   IMMUNE SYSTEM BOOSTER PO Take by mouth.   L-Theanine 200 MG Caps Take 200 mg by mouth daily.   LUBRICATING EYE DROPS OP Place 1 drop into both eyes daily as needed (dry eyes).   Lutein 20 MG Tabs Take 20 mg by mouth daily.   Milk Thistle 1000 MG Caps Take 1,000 mg by mouth daily.   multivitamin with minerals Tabs tablet Take 3 tablets by mouth daily.   OVER THE COUNTER MEDICATION Take 3 tablets by mouth at bedtime. Mag blue otc supplement   OVER THE COUNTER MEDICATION Apply 1 application topically daily as needed (pain). [employer] pain cream   PA PROBIOTIC COMPLEX PO Take by mouth.   PHAZYME PO Take 1 tablet by mouth daily as needed (gas/acid reflux).   Probiotic Acidophilus Caps Take 1 capsule by mouth daily.   traMADol 50 MG tablet Commonly known as: ULTRAM Take 1 tablet (50 mg total) by mouth every 4 (four) hours as needed for moderate pain.       ASK your doctor about these medications    [employer] Thyroid 60 MG tablet Generic drug: thyroid TAKE 1 TABLET DAILY               Durable Medical Equipment  (From admission, onward)           Start     Ordered   04/20/21 1232  DME Walker rolling  Once       Question:  Patient needs a walker to treat with the following condition  Answer:  Total knee replacement status   04/20/21 1231   04/20/21 1232  DME Bedside commode  Once       Question:  Patient needs a bedside commode to treat with the following condition  Answer:  Total knee replacement status   04/20/21 1231            Disposition: Home with home health PT     Follow-up Information     Fausto Skillern, PA-C Follow up on 05/05/2021.   Specialty: Orthopedic Surgery Why: at 11:00am Contact information: [place] [address] [phone]         Dereck Leep, MD Follow up on 06/04/2021.   Specialty: Orthopedic Surgery Why: at 2:45pm Contact information: [place] [address] [place],  PA-C 04/21/2021, 8:38 AM

## 2021-04-21 NOTE — Anesthesia Postprocedure Evaluation (Signed)
Anesthesia Post Note  Patient: Kelly Vaughn  Procedure(s) Performed: COMPUTER ASSISTED TOTAL KNEE ARTHROPLASTY (Right: Knee)  Patient location during evaluation: Nursing Unit Anesthesia Type: Spinal Level of consciousness: oriented and awake and alert Pain management: pain level controlled Vital Signs Assessment: post-procedure vital signs reviewed and stable Respiratory status: spontaneous breathing and respiratory function stable Cardiovascular status: blood pressure returned to baseline and stable Postop Assessment: no headache, no backache, no apparent nausea or vomiting and patient able to bend at knees Anesthetic complications: no   No notable events documented.   Last Vitals:  Vitals:   04/21/21 0544 04/21/21 0849  BP: (!) 113/59 (!) 110/55  Pulse: 61 95  Resp: 17 18  Temp: 36.5 C 36.6 C  SpO2: 100% 100%    Last Pain:  Vitals:   04/21/21 0849  TempSrc: Oral  PainSc:                  Norm Salt

## 2021-04-21 NOTE — Plan of Care (Signed)
Patient discharged home per MD orders at this time.All discharge instructions,education and medications reviewed with patient at bedside.Pt expressed understanding and will comply with dc instructions.follow up appointments and medication pick up was also communicated to patient.no verbal c/o or any ssx of distress.Pt discharged home with HH/PT services per order.Pt was transported home by husband in a private car.

## 2021-04-21 NOTE — Progress Notes (Signed)
Vitals entered manually ° °

## 2021-04-21 NOTE — Progress Notes (Addendum)
  Subjective: 1 Day Post-Op Procedure(s) (LRB): COMPUTER ASSISTED TOTAL KNEE ARTHROPLASTY (Right) Patient reports pain as well-controlled.   Patient is well, and has had no acute complaints or problems Plan is to go Home after hospital stay. Negative for chest pain and shortness of breath Fever: no Gastrointestinal: negative for nausea and vomiting.  Patient has not had a bowel movement.  Objective: Vital signs in last 24 hours: Temp:  [97.2 F (36.2 C)-98.3 F (36.8 C)] 97.7 F (36.5 C) (08/02 0544) Pulse Rate:  [61-87] 61 (08/02 0544) Resp:  [13-23] 17 (08/02 0544) BP: (89-145)/(53-101) 113/59 (08/02 0544) SpO2:  [95 %-100 %] 100 % (08/02 0544)  Intake/Output from previous day:  Intake/Output Summary (Last 24 hours) at 04/21/2021 0833 Last data filed at 04/21/2021 0700 Gross per 24 hour  Intake 2498.17 ml  Output 820 ml  Net 1678.17 ml    Intake/Output this shift: No intake/output data recorded.  Labs: No results for input(s): HGB in the last 72 hours. No results for input(s): WBC, RBC, HCT, PLT in the last 72 hours. No results for input(s): NA, K, CL, CO2, BUN, CREATININE, GLUCOSE, CALCIUM in the last 72 hours. No results for input(s): LABPT, INR in the last 72 hours.   EXAM General - Patient is Alert, Appropriate, and Oriented Extremity - Neurovascular intact Dorsiflexion/Plantar flexion intact Compartment soft Dressing/Incision -post op dressing in place, following removal of post op dressing no drainage noted  Motor Function - intact, moving foot and toes well on exam. Able to perform independent SLR.  Cardiovascular- Regular rate and rhythm, no murmurs/rubs/gallops Respiratory- Lungs clear to auscultation bilaterally Gastrointestinal- soft, nontender, and active bowel sounds   Assessment/Plan: 1 Day Post-Op Procedure(s) (LRB): COMPUTER ASSISTED TOTAL KNEE ARTHROPLASTY (Right) Active Problems:   Total knee replacement status  Estimated body mass index is  28.73 kg/m as calculated from the following:   Height as of this encounter: '5\' 7"'$  (1.702 m).   Weight as of this encounter: 83.2 kg. Advance diet Up with therapy  Discharge pending completion of PT goals   Post-op dressing removed. , Hemovac removed., and Mini compression dressing applied.   DVT Prophylaxis - Lovenox, Ted hose, and foot pumps Weight-Bearing as tolerated to right leg  Cassell Smiles, PA-C [facility] Orthopaedic Surgery 04/21/2021, 8:33 AM

## 2021-04-21 NOTE — Progress Notes (Signed)
Physical Therapy Treatment Patient Details Name: Kelly Vaughn MRN: [id number] DOB: [birth year] Today's Date: 04/21/2021    History of Present Illness Pt is 65 y.o. female s/p R TKA on 04/20/21.  Pt has PMH including: sleep apnea, headaches, anxitey, depression, bipolar disorder, anemia, arthritis, eating disorder, GERD, heart murmur, hypothyroidism, urinary incontinence, and hx L TKA 10/15/2019.    PT Comments    Pt received seated in recliner upon arrival to room.  Pt agreeable to therapy.  Pt continues to demonstrate good strength in R LE with decreased pain levels.  Pt does note some slight pain with bending the knee and is able to get 92 deg of flexion of the R knee.  Pt ambulated around the nursing station x8 before attempting the stairs and was able to recall order of ascending/descending without difficulty.  Pt then transferred back to recliner where all needs were met and call bell within reach.  Current discharge plans to HHPT remain appropriate at this time.  Pt will continue to benefit from skilled therapy in order to address deficits listed below.     Follow Up Recommendations  Home health PT     Equipment Recommendations  Rolling walker with 5" wheels;3in1 (PT)    Recommendations for Other Services       Precautions / Restrictions Precautions Precautions: Fall;Knee Precaution Booklet Issued: Yes (comment) Precaution Comments: no KI required Restrictions Weight Bearing Restrictions: Yes RLE Weight Bearing: Weight bearing as tolerated    Mobility  Bed Mobility Overal bed mobility: Modified Independent             General bed mobility comments: extra time given for mobility    Transfers Overall transfer level: Modified independent Equipment used: Rolling walker (2 wheeled)             General transfer comment: only needing VC's for hand placement as reminder  Ambulation/Gait Ambulation/Gait assistance: Min guard Gait Distance (Feet): 1200  Feet Assistive device: Rolling walker (2 wheeled) Gait Pattern/deviations: WFL(Within Functional Limits);Step-through pattern;Decreased step length - left;Decreased stance time - right Gait velocity: slightly decreased   General Gait Details: Pt progressively increase step length and was able to ambulate with increased speed towards end of session.   Stairs Stairs: Yes Stairs assistance: Min guard Stair Management: No rails;Step to pattern;Backwards;With walker Number of Stairs: 4 General stair comments: Pt with good stair training recall, however would benefit from practicing again at later session.   Wheelchair Mobility    Modified Rankin (Stroke Patients Only)       Balance Overall balance assessment: Needs assistance Sitting-balance support: No upper extremity supported;Feet supported Sitting balance-Leahy Scale: Normal     Standing balance support: Bilateral upper extremity supported;During functional activity Standing balance-Leahy Scale: Good                              Cognition Arousal/Alertness: Awake/alert Behavior During Therapy: WFL for tasks assessed/performed Overall Cognitive Status: Within Functional Limits for tasks assessed                                        Exercises      General Comments        Pertinent Vitals/Pain Pain Assessment: No/denies pain    Home Living  Prior Function            PT Goals (current goals can now be found in the care plan section)      Frequency    BID      PT Plan      Co-evaluation              AM-PAC PT "6 Clicks" Mobility   Outcome Measure  Help needed turning from your back to your side while in a flat bed without using bedrails?: None Help needed moving from lying on your back to sitting on the side of a flat bed without using bedrails?: None Help needed moving to and from a bed to a chair (including a wheelchair)?:  None Help needed standing up from a chair using your arms (e.g., wheelchair or bedside chair)?: None Help needed to walk in hospital room?: None Help needed climbing 3-5 steps with a railing? : None 6 Click Score: 24    End of Session Equipment Utilized During Treatment: Gait belt Activity Tolerance: Patient tolerated treatment well Patient left: with call bell/phone within reach;with family/visitor present;in chair Nurse Communication: Mobility status PT Visit Diagnosis: Other abnormalities of gait and mobility (R26.89);Muscle weakness (generalized) (M62.81)     Time: 2256-7209 PT Time Calculation (min) (ACUTE ONLY): 39 min  Charges:  $Gait Training: 38-52 mins                     Gwenlyn Saran, PT, DPT 04/21/21, 11:12 AM    [name] 04/21/2021, 11:10 AM

## 2021-05-20 ENCOUNTER — Other Ambulatory Visit: Payer: Self-pay | Admitting: Certified Nurse Midwife

## 2021-05-20 DIAGNOSIS — Z803 Family history of malignant neoplasm of breast: Secondary | ICD-10-CM

## 2021-05-20 DIAGNOSIS — Z87898 Personal history of other specified conditions: Secondary | ICD-10-CM

## 2021-06-03 ENCOUNTER — Ambulatory Visit
Admission: RE | Admit: 2021-06-03 | Discharge: 2021-06-03 | Disposition: A | Discharge status: Home / Self Care | Source: Ambulatory Visit | Attending: Nurse Practitioner | Admitting: Nurse Practitioner

## 2021-06-03 ENCOUNTER — Other Ambulatory Visit: Payer: Self-pay

## 2021-06-03 DIAGNOSIS — Z1231 Encounter for screening mammogram for malignant neoplasm of breast: Secondary | ICD-10-CM

## 2021-06-06 ENCOUNTER — Ambulatory Visit
Admission: RE | Admit: 2021-06-06 | Discharge: 2021-06-06 | Disposition: A | Discharge status: Home / Self Care | Source: Ambulatory Visit | Attending: Certified Nurse Midwife | Admitting: Certified Nurse Midwife

## 2021-06-06 DIAGNOSIS — Z803 Family history of malignant neoplasm of breast: Secondary | ICD-10-CM

## 2021-06-06 DIAGNOSIS — Z87898 Personal history of other specified conditions: Secondary | ICD-10-CM

## 2021-06-06 MED ORDER — GADOBUTROL 1 MMOL/ML IV SOLN
8.0000 mL | Freq: Once | INTRAVENOUS | Status: AC | PRN
  Administered 2021-06-06: 8 mL via INTRAVENOUS

## 2023-05-28 IMAGING — MR MR BREAST BILAT WO/W CM
9 of 13 series · 34 of 48 positions shown · IV contrast (8ml gadavist)
Comparison: Previous exam(s).

CLINICAL DATA: 64-year-old female with strong family history of
breast cancer.

LABS:  None performed on site.
EXAM:
BILATERAL BREAST MRI WITH AND WITHOUT CONTRAST
TECHNIQUE: Multiplanar, multisequence MR images of both breasts were obtained
prior to and following the intravenous administration of 8 ml of
Gadavist.

[Series 2: t2_tirm_tra ipat (a-p) · axial · 3.0mm · 0.70mm/px · 1 of 55 slices shown]
[im 1/55]
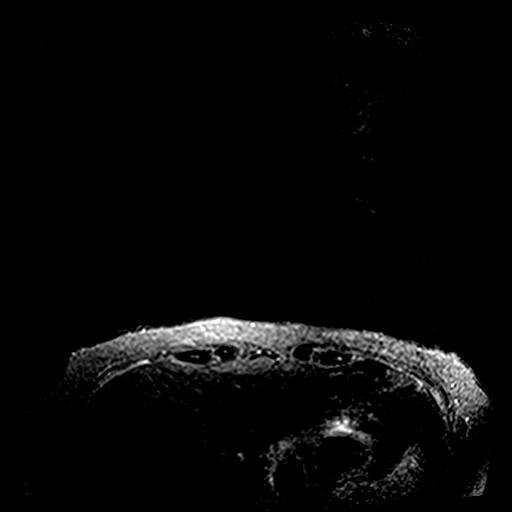

[Series 3: fl3d pre-cm no · axial · non-contrast · 1.2mm · 0.89mm/px · z∈[-99,+73]mm · 4 of 144 slices shown]
[im 1/144]
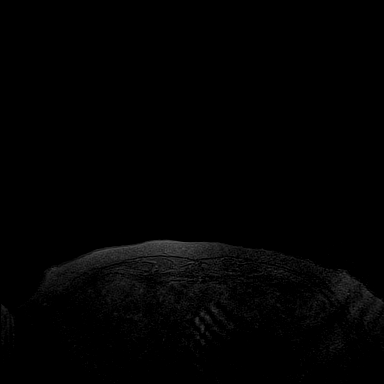
[im 48/144]
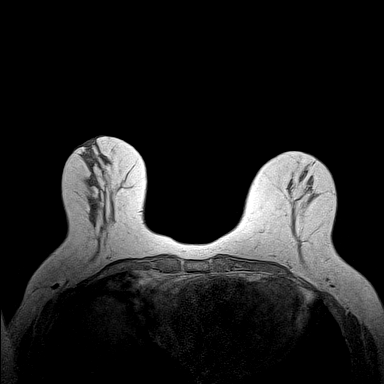
[im 96/144]
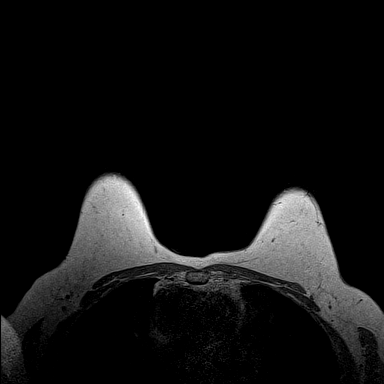
[im 144/144]
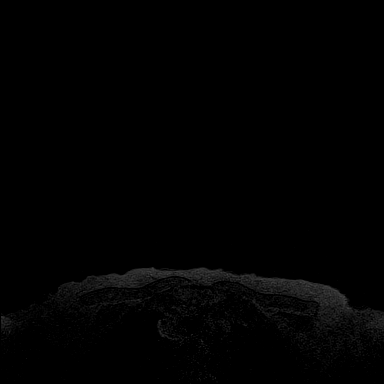

[Series 4: fl3d pre-cm · axial · non-contrast · 1.2mm · 0.89mm/px · z∈[-99,+73]mm · 5 of 144 slices shown (1 of 2)]
[im 1/144]
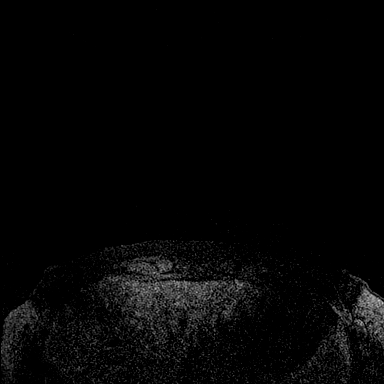
[im 36/144]
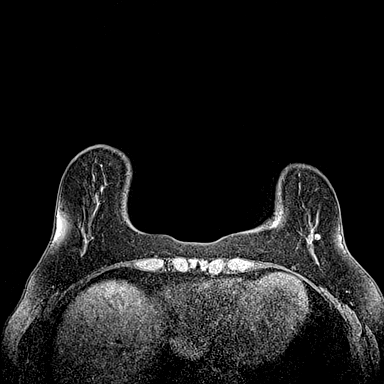
[im 72/144]
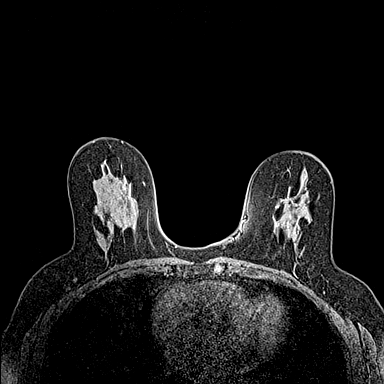
[im 108/144]
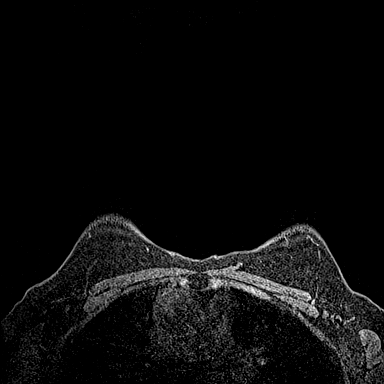
[im 144/144]
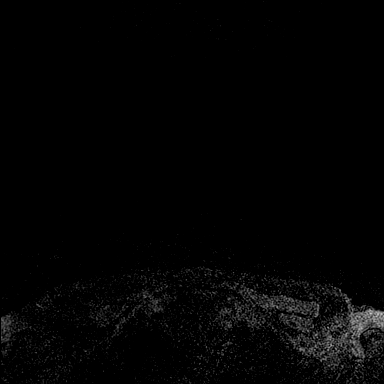

[Series 5: fl3d pre-cm · axial · non-contrast · 1.2mm · 0.89mm/px · z∈[-110,+61]mm · 5 of 144 slices shown (2 of 2)]
[im 1/144]
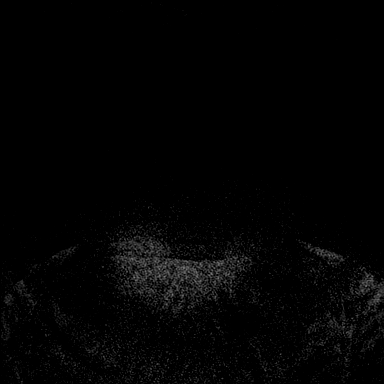
[im 36/144]
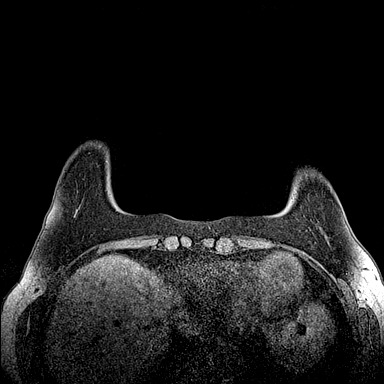
[im 72/144]
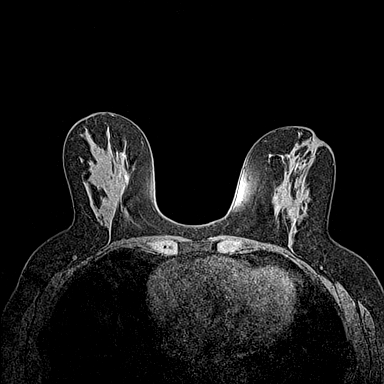
[im 108/144]
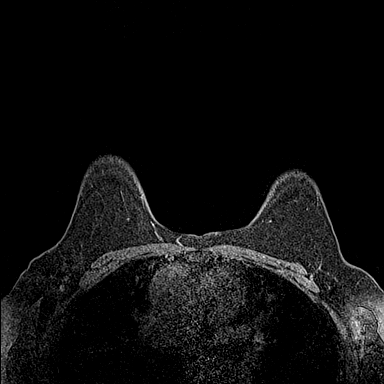
[im 144/144]
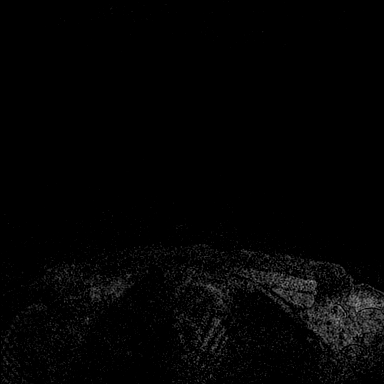

[Series 6: fl3d post-cm 20 · axial · 1.2mm · 0.89mm/px · z∈[-110,+61]mm · 5 of 144 slices shown (1 of 3)]
[im 1/144]
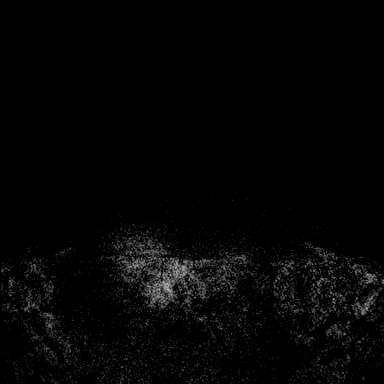
[im 36/144]
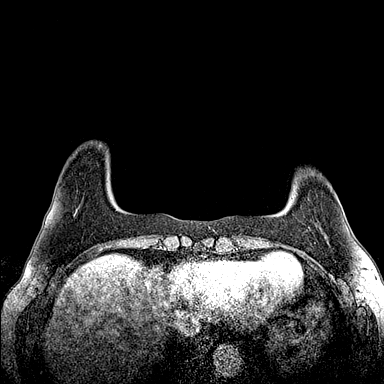
[im 72/144]
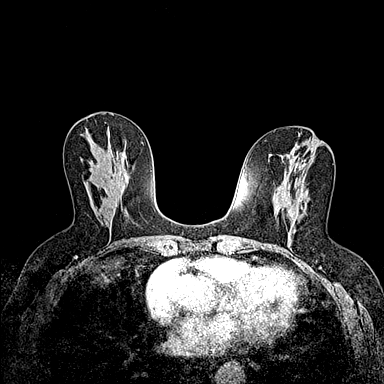
[im 108/144]
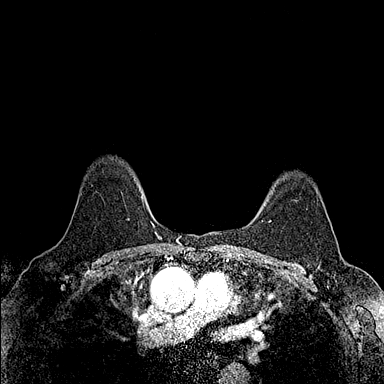
[im 144/144]
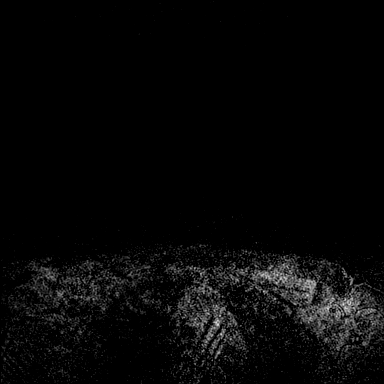

[Series 7: fl3d post-cm 20 · axial · 1.2mm · 0.89mm/px · z∈[-110,+61]mm · 5 of 144 slices shown (2 of 3)]
[im 1/144]
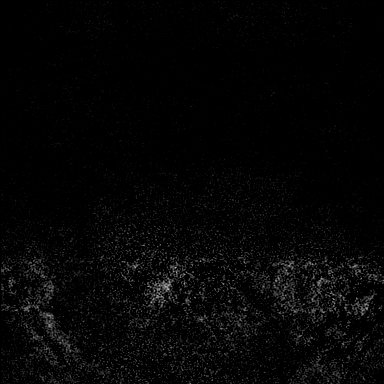
[im 36/144]
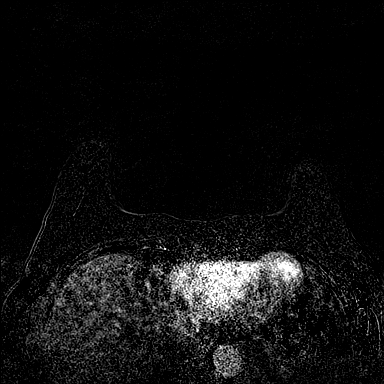
[im 72/144]
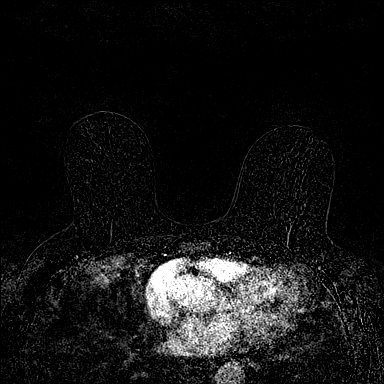
[im 108/144]
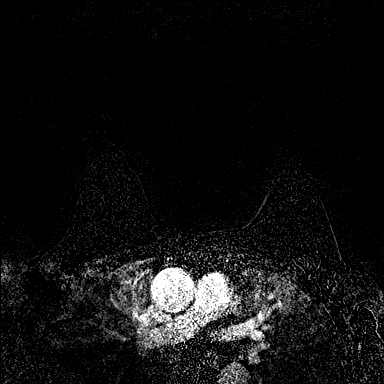
[im 144/144]
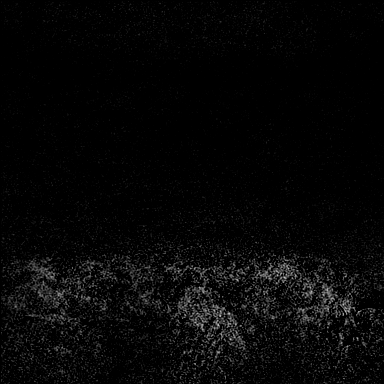

[Series 8: fl3d post-cm 20 · axial · 172.8mm · 0.89mm/px · 1 of 1 slices shown (3 of 3)]
[im 1/1]
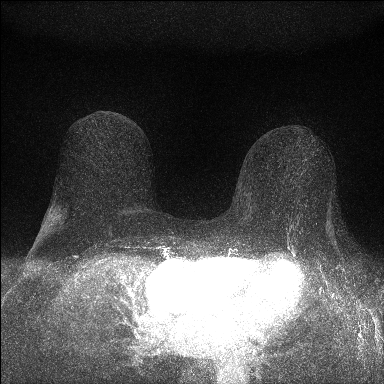

[Series 9: fl3d post-cm 3 · axial · 1.2mm · 0.89mm/px · z∈[-110,+61]mm · 5 of 144 slices shown (1 of 2)]
[im 1/144]
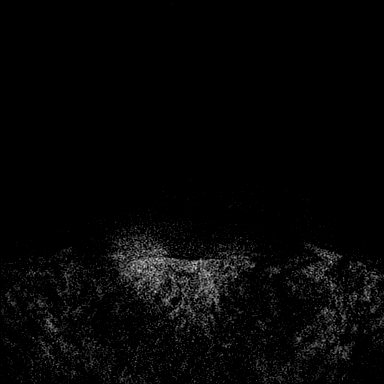
[im 36/144]
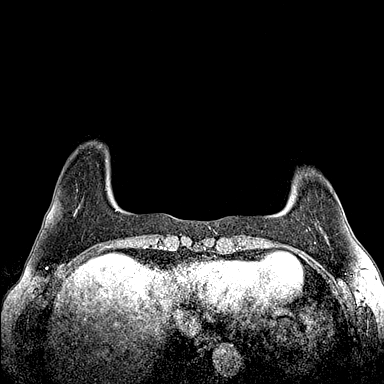
[im 72/144]
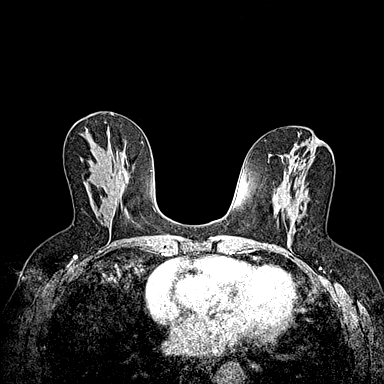
[im 108/144]
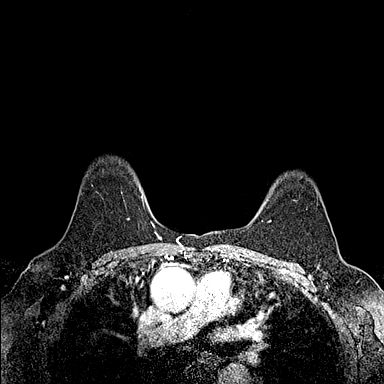
[im 144/144]
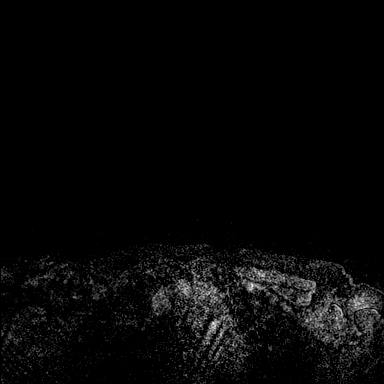

[Series 10: fl3d post-cm 3 · axial · 1.2mm · 0.89mm/px · z∈[-110,-25]mm · 3 of 144 slices shown (2 of 2)]
[im 1/144]
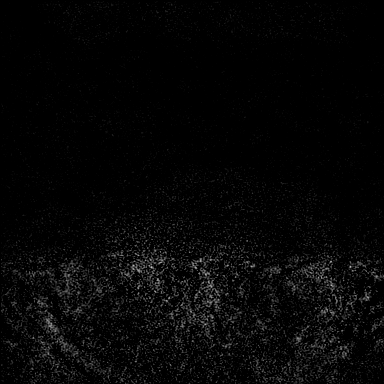
[im 36/144]
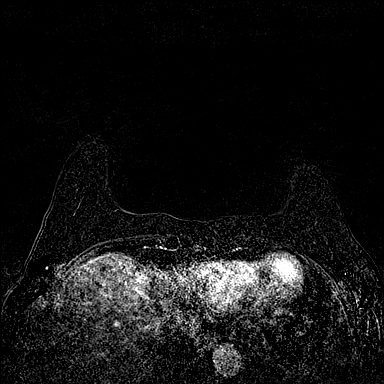
[im 72/144]
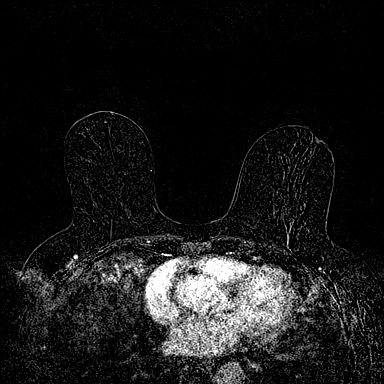

[34 of 48 positions shown; findings below may reference images not displayed]

Three-dimensional MR images were rendered by post-processing of the
original MR data on an independent workstation. The
three-dimensional MR images were interpreted, and findings are
reported in the following complete MRI report for this study. Three
dimensional images were evaluated at the independent interpreting
workstation using the DynaCAD thin client.
FINDINGS: Breast composition: c. Heterogeneous fibroglandular tissue.

Background parenchymal enhancement: Minimal.

Right breast: No mass or abnormal enhancement.

Left breast: No mass or abnormal enhancement.

Lymph nodes: No abnormal appearing lymph nodes.

Ancillary findings:  None.
IMPRESSION: No MRI evidence of malignancy.

RECOMMENDATION:
Routine annual screening with mammography and breast MRI.

BI-RADS CATEGORY  1: Negative.
